# Patient Record
Sex: Male | Born: 1950 | Race: White | Hispanic: No | Marital: Married
Health system: Midwestern US, Community
[De-identification: ages and names within clinical notes are randomized; demographics above are authoritative.]

## PROBLEM LIST (undated history)

## (undated) DIAGNOSIS — R079 Chest pain, unspecified: Secondary | ICD-10-CM

## (undated) DIAGNOSIS — I471 Supraventricular tachycardia, unspecified: Secondary | ICD-10-CM

## (undated) DIAGNOSIS — Z8546 Personal history of malignant neoplasm of prostate: Secondary | ICD-10-CM

## (undated) DIAGNOSIS — R7303 Prediabetes: Secondary | ICD-10-CM

## (undated) DIAGNOSIS — Z125 Encounter for screening for malignant neoplasm of prostate: Secondary | ICD-10-CM

## (undated) DIAGNOSIS — E782 Mixed hyperlipidemia: Principal | ICD-10-CM

## (undated) DIAGNOSIS — Z1159 Encounter for screening for other viral diseases: Secondary | ICD-10-CM

---

## 2016-05-04 NOTE — Discharge Summary (Signed)
 Inpatient Clinical Summary             [facility]  Post-Acute Care Transfer Instructions  PERSON INFORMATION   Name: Wayne Stone, Wayne Stone   MRN: [id number]    FIN#: [id number]   PHYSICIANS  Admitting Physician: SIMPSON-MD,  DAMON  Attending Physician: SIMPSON-MD,  DAMON   PCP: Pcp, None  Discharge Diagnosis: Incarcerated umbilical hernia  Comment:       PATIENT EDUCATION INFORMATION  Instructions:             Anesthesia: After Your Surgery; HERNIA Antonetta [phone])  Medication Leaflets:               Follow-up:                          With: Address: When:   DAMON SIMPSON-MD [address]  [place]  [address]  [phone] Business (1) In 2 weeks 05/18/2016                           MEDICATION LIST  Medication Reconciliation at Discharge:         New Medications  Printed Prescriptions  HYDROcodone-acetaminophen  (Norco 325 mg-5 mg oral tablet) 1-2 tabs Oral (given by mouth) every 6 hours as needed moderate pain (4-7). Refills: 0.  Last Dose:____________________  Medications That Have Not Changed  Other Medications  aspirin (aspirin 81 mg oral tablet) 1 Tabs Oral (given by mouth) every day.  Last Dose:____________________  multivitamin (Multiple Vitamins oral tablet) 1 Tabs Oral (given by mouth) every day.  Last Dose:____________________         Patient's Final Home Medication List Upon Discharge:          aspirin (aspirin 81 mg oral tablet) 1 Tabs Oral (given by mouth) every day.  HYDROcodone-acetaminophen  (Norco 325 mg-5 mg oral tablet) 1-2 tabs Oral (given by mouth) every 6 hours as needed moderate pain (4-7). Refills: 0.  multivitamin (Multiple Vitamins oral tablet) 1 Tabs Oral (given by mouth) every day.         Comment:       ORDERS         Order Name Order Details   Discharge Patient 05/04/16 10:28:00 EDT, Discharge Home/Self Care

## 2016-05-04 NOTE — Discharge Summary (Signed)
 Inpatient Patient Summary               [facility]  [address]  [place], [place] [address]  [phone]  Patient Discharge Instructions     Name: Wayne Stone, Wayne Stone  Current Date: 05/04/2016 10:33:18  DOB: [birth year] MRN: [id number] FIN: [id number]  Patient Address: [address] [place] [facility] [address]  Patient Phone: [phone]  Primary Care Provider:  Name: Pcp, None  Phone:    Immunizations Provided:       Discharge Diagnosis: Incarcerated umbilical hernia  Discharged To: TO, ANTICIPATED%>  Home Treatments: TREATMENTS, ANTICIPATED%>  Devices/Equipment: EQUIPMENT REHAB%>  Post Hospital Services: HOSPITAL SERVICES%>  Professional Skilled Services: SKILLED SERVICES%>  Therapist, sports and Community Resources:                SERV AND COMM RES, ANTICIPATED%>  Mode of Discharge Transportation: TRANSPORTATION%>  Discharge Orders         Discharge Patient 05/04/16 10:28:00 EDT, Discharge Home/Self Care         Comment:      Medications   During the course of your visit, your medication list was updated with the most current information. The details of those changes are reflected below:         New Medications  Printed Prescriptions  HYDROcodone-acetaminophen  (Norco 325 mg-5 mg oral tablet) 1-2 tabs Oral (given by mouth) every 6 hours as needed moderate pain (4-7). Refills: 0.  Last Dose:____________________  Medications That Have Not Changed  Other Medications  aspirin (aspirin 81 mg oral tablet) 1 Tabs Oral (given by mouth) every day.  Last Dose:____________________  multivitamin (Multiple Vitamins oral tablet) 1 Tabs Oral (given by mouth) every day.  Last Dose:____________________         [facility] would like to thank you for allowing us  to assist you with your healthcare needs. The following includes patient education materials and information regarding your injury/illness.     Wayne Stone, Wayne Stone has been given the following list of follow-up instructions, prescriptions, and patient education  materials:  Follow-up Instructions             With: Address: When:   DAMON SIMPSON-MD [address], [address]  [place]  [address]  [phone] Business (1) In 2 weeks 05/18/2016                       It is important to always keep an active list of medications available so that you can share with other providers and manage your medications appropriately. As an additional courtesy, we are also providing you with your final active medications list that you can keep with you.           aspirin (aspirin 81 mg oral tablet) 1 Tabs Oral (given by mouth) every day.  HYDROcodone-acetaminophen  (Norco 325 mg-5 mg oral tablet) 1-2 tabs Oral (given by mouth) every 6 hours as needed moderate pain (4-7). Refills: 0.  multivitamin (Multiple Vitamins oral tablet) 1 Tabs Oral (given by mouth) every day.      Take only the medications listed above. Contact your doctor prior to taking any medications not on this list.        Discharge instructions, if any, will display below     Instructions for Diet: INSTRUCTIONS FOR DIET%>A Healthy Diet   Instructions for Supplements: SUPPLEMENT INSTRUCTIONS%>   Instructions for Activity: INSTRUCTIONS FOR ACTIVITY%>May shower, No Baths/Hot Tubs/Oceans/ or Pools, No bending, twisting or lifting,  You may drive if comfortable and not taking pain medication   Instructions for Wound Care: INSTRUCTIONS FOR WOUND CARE%>Other: Bacitracin to bellybutton daily.     Medication leaflets, if any, will display below         Patient education materials, if any, will display below        Discharge Instructions: After Your Surgery   Youve just had surgery. During surgery, you were given medicine called anesthesia to keep you relaxed and free of pain. After surgery, you may have some pain or nausea. This is common. Here are some tips for feeling better and getting well after surgery.       Stay on schedule with your medicine.    Going home   Your healthcare provider will show you how to take care of  yourself when you go home. He or she will also answer your questions. Have an adult family member or friend drive you home. For the first 24 hours after your surgery:    Do not drive or use heavy equipment.    Do not make important decisions or sign legal papers.    Do not drink alcohol.    Have someone stay with you, if needed. He or she can watch for problems and help keep you safe.   Be sure to go to all follow-up visits with your healthcare provider. And rest after your surgery for as long as your healthcare provider tells you to.   Coping with pain   If you have pain after surgery, pain medicine will help you feel better. Take it as told, before pain becomes severe. Also, ask your healthcare provider or pharmacist about other ways to control pain. This might be with heat, ice, or relaxation. And follow any other instructions your surgeon or nurse gives you.   Tips for taking pain medicine   To get the best relief possible, remember these points:    Pain medicines can upset your stomach. Taking them with a little food may help.    Most pain relievers taken by mouth need at least 20 to 30 minutes to start to work.    Taking medicine on a schedule can help you remember to take it. Try to time your medicine so that you can take it before starting an activity. This might be before you get dressed, go for a walk, or sit down for dinner.    Constipation is a common side effect of pain medicines. Call your healthcare provider before taking any medicines such as laxatives or stool softeners to help ease constipation. Also ask if you should skip any foods. Drinking lots of fluids and eating foods such as fruits and vegetables that are high in fiber can also help. Remember, do not take laxatives unless your surgeon has prescribed them.    Drinking alcohol and taking pain medicine can cause dizziness and slow your breathing. It can even be deadly. Do not drink alcohol while taking pain medicine.    Pain medicine  can make you react more slowly to things. Do not drive or run machinery while taking pain medicine.   Your healthcare provider may tell you to take acetaminophen  to help ease your pain. Ask him or her how much you are supposed to take each day. Acetaminophen  or other pain relievers may interact with your prescription medicines or other over-the-counter (OTC) medicines. Some prescription medicines have acetaminophen  and other ingredients. Using both prescription and OTC acetaminophen  for pain can cause you to overdose.  Read the labels on your OTC medicines with care. This will help you to clearly know the list of ingredients, how much to take, and any warnings. It may also help you not take too much acetaminophen . If you have questions or do not understand the information, ask your pharmacist or healthcare provider to explain it to you before you take the OTC medicine.   Managing nausea   Some people have an upset stomach after surgery. This is often because of anesthesia, pain, or pain medicine, or the stress of surgery. These tips will help you handle nausea and eat healthy foods as you get better. If you were on a special food plan before surgery, ask your healthcare provider if you should follow it while you get better. These tips may help:    Do not push yourself to eat. Your body will tell you when to eat and how much.    Start off with clear liquids and soup. They are easier to digest.    Next try semi-solid foods, such as mashed potatoes, applesauce, and gelatin, as you feel ready.    Slowly move to solid foods. Dont eat fatty, rich, or spicy foods at first.    Do not force yourself to have 3 large meals a day. Instead eat smaller amounts more often.    Take pain medicines with a small amount of solid food, such as crackers or toast, to avoid nausea.       Call your surgeon if.    You still have pain an hour after taking medicine. The medicine may not be strong enough.    You feel too sleepy, dizzy,  or groggy. The medicine may be too strong.    You have side effects like nausea, vomiting, or skin changes, such as rash, itching, or hives.        If you have obstructive sleep apnea   You were given anesthesia medicine during surgery to keep you comfortable and free of pain. After surgery, you may have more apnea spells because of this medicine and other medicines you were given. The spells may last longer than usual.    At home:    Keep using the continuous positive airway pressure (CPAP) device when you sleep. Unless your healthcare provider tells you not to, use it when you sleep, day or night. CPAP is a common device used to treat obstructive sleep apnea.    Talk with your provider before taking any pain medicine, muscle relaxants, or sedatives. Your provider will tell you about the possible dangers of taking these medicines.      2000-2017 [employer] [address], [place], [place] [address]. All rights reserved. This information is not intended as a substitute for professional medical care. Always follow your healthcare professional's instructions.                Hernia [Adult]    A hernia is a bulge of the intestines or surrounding tissues through a tear in the muscle of the abdomen or groin. This may occur as a result of excessive coughing, heavy lifting or being overweight. It can also occur at the site of prior surgery. When a hernia first appears it may be painful due to stretching and tearing of the muscle fibers. When you lie down, the bulge should reduce in size or disappear completely. If it does not, and you are unable to flatten it with your hand, medical attention is needed at once.  Home Care:  Avoid heavy lifting and straining or any activities that cause pain in the hernia.  You may shower.  Do not soak in a tub or pool for 2 weeks.    Follow Up  with your physician as directed by our staff.  Call [phone] for followup appointment.    Get Prompt Medical Attention  if  any of the following occur:   Increasing size of the hernia   Increasing pain in the hernia   A hernia that does not get smaller when you lie down   Hardening of the hernia   Abdominal swelling   Pain moves to the lower right abdomen (just below the waistline) or spreads to the back   Temp greater than 101   Nausea and Vomiting   Diarrhea or Constipation     2000-2015 [employer] [address] [place], [place] [address]. All rights reserved. This information is not intended as a substitute for professional medical care. Always follow your healthcare professional's instructions.                IS IT A STROKE?  Act FAST and Check for these signs:     FACE                  Does the face look uneven?     ARM                    Does one arm drift down?     SPEECH             Does their speech sound strange?     TIME                   Call 9-1-1 at any sign of stroke  Heart Attack Signs  Chest discomfort: Most heart attacks involve discomfort in the center of the chest and lasts more than a few minutes, or goes away and comes back. It can feel like uncomfortable pressure, squeezing, fullness or pain.  Discomfort in upper body: Symptoms can include pain or discomfort in one or both arms, back, neck, jaw or stomach.  Shortness of breath: With or without discomfort.  Other signs: Breaking out in a cold sweat, nausea, or lightheaded.  Remember, MINUTES DO MATTER. If you experience any of these heart attack warning signs, call 9-1-1 to get immediate medical attention!             Yes - Patient/Family/Caregiver demonstrates understanding of instructions given  ______________________________ ___________ ___________________ ___________  Patient/Family/ Caregiver Signature Date/Time          Provider Signature Date/Time

## 2016-05-04 NOTE — Nursing Note (Signed)
Nursing Discharge Summary - Text       Physician Discharge Summary Entered On:  05/04/2016 10:26 EDT    Performed On:  05/04/2016 10:26 EDT by Ivonne AndrewSIMPSON-MD,  Niles Ess               DC Information   Provider Instructions for Wound Care :   Other: Bacitracin to bellybutton daily.    Vinnie LevelROVENZANO, RN, RACHEL V - 05/04/2016 10:32 EDT   Provider Instructions for Diet :   A Healthy Diet   Provider Instructions for Activity :   May shower, No Baths/Hot Tubs/Oceans/ or Pools, No bending, twisting or lifting, You may drive if comfortable and not taking pain medication   Charene Mccallister-MD,  Trenee Igoe - 05/04/2016 10:26 EDT

## 2016-05-04 NOTE — Procedures (Signed)
 IntraOp Record - [facility]             IntraOp Record - [facility] Summary                                                                   Primary Physician:        SIMPSON-MD,  DAMON    Case Number:              [id number]    Finalized Date/Time:      05/04/16 10:20:26    Pt. Name:                 Wayne Stone, Wayne Stone    D.O.B./Sex:               07-08-1951    Male    Med Rec #:                [id number]    Physician:                DARIEN PRUDE    Financial #:              [id number]    Pt. Type:                 S    Room/Bed:                 /    Admit/Disch:              05/04/16 06:00:00 -    Institution:       [facility] - Case Times                                                                                                         Entry 1                                                                                                          Patient      In Room Time             05/04/16 08:00:00               Out Room Time                   05/04/16 10:20:00    Anesthesia     Procedure  Start Time               05/04/16 08:26:00               Stop Time                       05/04/16 10:13:00    Last Modified By:         PERRI, RN, KELLY                              05/04/16 10:20:21      [facility] - Case Times Audit                                                                          05/04/16 10:20:21         Owner: GEANNIE                               Modifier: SCARKE                                                        <+> 1         Out Room Time     05/04/16 10:16:21         Owner: GEANNIE                               Modifier: GEANNIE                                                        <+> 1         Stop Time     05/04/16 08:32:11         Owner: GEANNIE                               Modifier: GEANNIE                                                        <+> 1         Start Time        [facility] - Safety Checklist - Sign In  Entry 1                                                                                                          History/Physical on       Yes                             Procedure Consent               Yes    Chart                                                     on Chart     Site Marked (if           Yes    applicable)     Last Modified By:         PERRI, RN, KELLY                              05/04/16 08:22:44      [facility] - Case Attendance                                                                                                    Entry 1                         Entry 2                         Entry 3                                          Case Attendee             WILSON-MD,  JAMES P             SIMPSON-MD,  DAMON              PRITCHER, RN, LISA A    Role Performed            Anesthesiologist                Surgeon Primary                 First Assistant    Time In  05/04/16 08:00:00               05/04/16 08:00:00               05/04/16 08:00:00    Time Out     Procedure                 Hernia Repair Ventral           Hernia Repair Ventral           Hernia Repair Ventral                              or Incisional Robo              or Incisional Robo              or Incisional Robo    Last Modified By:         PERRI RN, BURNARD PERRI, RN, KELLY           SCARBROUGH, RN, KELLY                              05/04/16 08:32:02               05/04/16 08:32:02               05/04/16 08:32:02                                Entry 4                         Entry 5                                                                          Case Attendee             MARIJEAN, LPN, MADELIN KATHEE PERRI, RN, KELLY    Role Performed            Surgical Scrub                  Circulator    Time In                   05/04/16 08:00:00               05/04/16 08:00:00    Time Out     Procedure                 Hernia Repair Ventral           Hernia Repair Ventral                               or Incisional Robo              or Incisional Robo    Last Modified By:  [facility], RN, BURNARD MOONS, RN, [facility]                              05/04/16 08:32:02               05/04/16 08:32:02      [facility] - Case Attendance Audit                                                                     05/04/16 08:32:02         Owner: GEANNIE                               Modifier: SCARKE                                                            1     <*> Procedure                              Hernia Repair Ventral or Incisional Robo            2     <*> Procedure                              Hernia Repair Ventral or Incisional Robo            3     <+> Time In            3     <*> Procedure                              Hernia Repair Ventral or Incisional Robo            4     <+> Time In            4     <*> Procedure                              Hernia Repair Ventral or Incisional Robo            5     <+> Time In            5     <*> Procedure                              Hernia Repair Ventral or Incisional Robo     05/04/16 08:21:56         Owner: GEANNIE  Modifier: SCARKE                                                            1     <+> Time In            1     <*> Procedure                              Hernia Repair Ventral or Incisional Robo            2     <+> Time In            2     <*> Procedure                              Hernia Repair Ventral or Incisional Robo        <+> 3         Case Attendee        <+> 3         Role Performed        <+> 3         Procedure        <+> 4         Case Attendee        <+> 4         Role Performed        <+> 4         Procedure        <+> 5         Case Attendee        <+> 5         Role Performed        <+> 5         Procedure        [facility] - Skin Assessment                                                                          Pre-Care Text:            A.240 Assesses baseline skin condition Im.120 Implements  protective measures to prevent skin or tissue injury           due to mechanical sources  Im.280.1 Implements progective measures to prevent skin or tissue injury due to           thermal sources Im.360 Monitors for signs and symptons of infection                              Entry 1  Skin Integrity            Intact    Last Modified ByBETHA MOONS, RN, KELLY                              05/04/16 08:43:28    Post-Care Text:            E.10 Evaluates for signs and symptoms of physical injury to skin and tissue E.270 Evaluate tissue perfusion           O.60 Patient is free from signs and symptoms of injury caused by extraneous objects   O.210 Patinet's tissue           perfusion is consistent with or improved from baseline levels      [facility] - Patient Positioning                                                                      Pre-Care Text:            A.240 Assesses baseline skin condition A.280 Identifies baseline musculoskeletal status A.280.1 Identifies           physical alterations that require additional precautions for procedure-specific positioning A.510.8 Maintains           patient's dignity and privacy Im.120 Implements protective measures to prevent skin/tissue injury due to           mechanical sources Im.40 Positions the patient Im.80 Applies safety devices                              Entry 1                                                                                                          Procedure                 Hernia Repair Ventral           Body Position                   Supine                              or Incisional Robo    Left Arm Position         Tucked and Padded at            Right Arm Position              Tucked and Padded at  Side                                                            Side    Left Leg Position         Extended  Security               Right Leg Position              Extended Security                              Strap, Pillow Under Knee                                        Strap, Pillow Under Knee    Feet Uncrossed            Yes                             Pressure Points                 Yes                                                              Checked     Positioning Device        Pillow, Gel Pad Full            Positioned By                   OTHO, RN, LISA A,                              Body, Head Rest Foam,                                           SCARBROUGH, RN, KELLY                              Safety Strap    Outcome Met (O.80)        Yes    Last Modified ByBETHA MOONS, RN, KELLY                              05/04/16 08:38:33    Post-Care Text:            A.240 Assesses baseline skin condition A.280 Identifies baseline musculoskeletal status A.280.1 Identifies           physical alterations that require additional precautions for procedure-specific positioning A.510.8 Maintains           patient's dignity and privacy Im.120 Implements  protective measures to prevent skin/tissue injury due to           mechanical sources Im.40 Positions the patient Im.80 Applies safety devices      RH[facility] - Skin Prep                                                                                Pre-Care Text:            A.30 Verifies allergies A.20 Verifies procedure, surgical site, and laterality A.510.8 Maintains paritnet's           dignity and privacy Im.270 Performs Skin Preparation Im.270.1 Implements protective measures to prevent skin           and tissue injury due to chemical sources  A.300.1 Protects from cross-contamination                              Entry 1                                                                                                          Hair Removal     Skin Prep      Prep Agents (Im.270)     Chlorhexidine Gluconate         Prep Area (Im.270)              Abdomen                               2% w/Alcohol     Prep By                  DARIEN,  DAMON    Outcome Met (O.100)       Yes    Last Modified ByBETHA MOONS, RN, KELLY                              05/04/16 08:38:56    Post-Care Text:            E.10 Evaluates for signs and symptoms of physical injury to skin and tissue O.100 Patient is free from signs           and symptoms of chemical injury  O.740 The patient's right to privacy is maintained      [facility] - Counts Initial and Final  Pre-Care Text:            A.20.2 - Assesses the risk for unintended retained surgical items Im.20 - Performs required counts                              Entry 1                                                                                                          Initial Counts      Initial Counts           MITCHUM, LPN, TAMMY B,          Items included in               Instruments, Sponges,     Performed By             PERRI, RN, KELLY           the Initial Count               Sharps    Final Counts      Final Counts             SCARBROUGH, RN, KELLY,          Final Count Status              Correct     Performed By             MARIJEAN, LPN, TAMMY B     Items Included in        Sponges, Sharps     Final Count     Outcome Met (O.20)        Yes    Last Modified ByBETHA PERRI, RN, KELLY                              05/04/16 10:07:39    Post-Care Text:            E.50 - Evaluates results of the surgical count O.20 - Patient is free from unintended retained surgical items      [facility] - Counts Initial and Final Audit                                                            05/04/16 10:07:39         Owner: GEANNIE                               Modifier: SCARKE                                                        <+>  1         Final Count Status        <+> 1         Outcome Met (O.20)        [facility] - Counts Additional                                                                         Pre-Care Text:            A.20 Verifies operative procedure, sugical site, and laterality A.20.2 Assesses the risk for unintended           retained foreign body Im.20 Performs required counts                              Entry 1                                                                                                          Additional Count          Closing Count                   Additional Count                MITCHUM, LPN, TAMMY B,    Type                                                      Participants                    SCARBROUGH, RN, KELLY    Count Status              Correct                         Items Counted                   Sponges, Sharps    Outcome Met (O.20)        Yes    Last Modified ByBETHA MOONS, RN, KELLY                              05/04/16 10:07:32    Post-Care Text:            E.50 Evaluates results of the surgical count O.20 Patient is free from unintended retained foreign objects      [facility] - Counts Additional Audit  05/04/16 10:07:32         Owner: GEANNIE                               Modifier: SCARKE                                                        <+> 1         Count Status        <+> 1         Outcome Met (O.20)        [facility] - General Case Data                                                                        Pre-Care Text:            A.350.1 Classifies surgical wound                              Entry 1                                                                                                          Case Information      ASA Class                2                               Case Level                      Level 4     OR                       RH7 03                          Specialty                       Robotics (SN)     Wound Class              1-Clean    Preop Diagnosis           UMBILICAL HERNIA    Last Modified By:         PERRI, RN, KELLY  05/04/16 08:22:19    Post-Care Text:            O.760 Patient receives consistent and comparable care regardless of the setting      [facility] - Fire Risk Assessment                                                                                               Entry 1                                                                                                          Fire Risk                 Alcohol Based Prep              Fire Risk Score                 2    Assessment: If            Solution, Ignition    checked, checkmark        Source In Use    = 1 point     Last Modified By:         PERRI, RN, KELLY                              05/04/16 08:41:50      RH[facility] - Safety Checklist - Sign Out                                                              Pre-Care Text:            Im.330 Manages specimen handling and disposition                              Entry 1  Patient Safety            Yes    Communication Guide     Used Throughout Case     Last Modified ByBETHA MOONS, RN, KELLY                              05/04/16 10:07:20    Post-Care Text:            E.800 Ensures continuity of care E.50 Evaluates results of the surgical count O.30 Patient's procedure is           performed on the correct site, side, and level O.50 patient's current status is communicated throughout the           continuum of care O.40 Patient's specimen(s) is managed in the appropriate manner      RH[facility] - Cautery                                                                                  Pre-Care Text:            A.240 Assesses baseline skin condition A280.1 Identifies baseline musculoskeletal status Im.50 Implements           protective measures to prevent injury due to electrical sources  Im.60 Uses supplies and equipment within safe           parameters Im.80 Applies safety devices                               Entry 1                                                                                                          ESU Type                  GENERATOR                       Identification                  [id number]                              COVIDIEN/[employer]              Number     Coag Setting (watts)      35                              Cut Setting (watts)  35    Endocut                   No                              Grounding Pad                   Yes                                                              Needed?     Grounding Pad Site        Thigh, left                     Grounding Pad                   PRITCHER, RN, LISA A                                                              Applied By     Outcome Met (O.10)        Yes    Last Modified ByBETHA MOONS, RN, KELLY                              05/04/16 08:36:19    Post-Care Text:            E.10 Evaluates for signs and symptoms of physical injury to skin and tissue O.10 Patient is free from signs and           symptoms of injury related to thermal sources  O.70 Patient is free from signs and symptoms of electrical injury      [facility] - Patient Care Devices                                                                     Pre-Care Text:            A.200 Assesses risk for normothermia regulation A.40 Verifies presence of prosthetics or corrective devices           Im.280 Implements thermoregulation measures Im.60 Uses supplies and equipment within safe parameters                              Entry 1                         Entry 2  Equipment Type            MACHINE SEQUENTIAL              BAIR HUGGER                              COMPRESSION    SCD Sleeve Site           Legs Bilateral    Equipment/Tag Number      [id number]                           [phone]    Initiated Pre             Yes    Induction     Last Modified By:         PERRI RN, BURNARD PERRI, RN, KELLY                              05/04/16 08:37:40               05/04/16 08:37:40    Post-Care Text:            E.10 Evaluates signs and symptoms of physical injury to skin and tissue O.60 Patient is free from signs and           symptoms of injury caused by extraneous objects      [facility] - Medications                                                                              Pre-Care Text:            A.10 Confirms patient identity A.30 Verifies allergies Im.220 Administers prescribed medications Im.220.2           Administers prescribed antibiotic therapy as ordered                              Entry 1                                                                                                          Time Administered         05/04/16 08:26:00               Medication                      INACTIVE ITEM ***  zzBUPIVACAINE 0.5%                                                                                              EPINEPHRINE INJECTION                                                                                                 Route of Admin            Local Injection                 Dose/Volume                                                                                  (include amount and                                                               unit of measure)     Site                      Abdomen                         Administered By                 DARIEN,  DAMON    Outcome Met (O.130)       Yes    Last Modified ByBETHA MOONS, RN, KELLY                              05/04/16 08:49:12    Post-Care Text:            E.20 Evaluates response to medications O.130 Patient receives appropriately administerd medication(s)      RH[facility] - Implants/Endoscopy Stents  Pre-Care Text:            A.20 Verifies  operative procedure, surgical site, and laterality A.20.1 Verifies consent for planned procedure           Im.350 Records implants inserted during the operative or invasive procedure                              Entry 1                                                                                                          Implant/Explant           Implant                         Catalog #                      PMH    Implant     Identification      Description              MESH HERNIA WENDIE NI         Expiration Date                 01/23/21                              OLEGARIO NI     Lot Number               [id number]                          Manufacturer                    [employer]    Usage Data      Implant Site             Umbilical hernia                Quantity                        1    Last Modified By:         PERRI, RN, KELLY                              05/04/16 09:07:00    Post-Care Text:            E.30 Evaluates verification process for correct patient, site, side and level surgery O.30 Patient's procedure           is performed on the correct site, side, and level      [facility] - Communication  Pre-Care Text:            A.520 Identifies barriers to communication (Patient and Family Communications) A.20 Verifies operative           procedure, surgical site, and laterality Sports coach) Im.500 Provides status reports to family           members Im.150 Develops individualized plan of care                              Entry 1                         Entry 2                                                                          Communication             Phone Call                      Phone Call    Communication By          [facility], RN, BURNARD MOONS, RN, KELLY    Date and Time             05/04/16 09:15:00               05/04/16 10:07:00    Communication To          friend Marcey                    friend  Marcey    Last Modified By:         MOONS, RN, BURNARD MOONS, RN, [facility]                              05/04/16 09:29:32               05/04/16 10:10:43    Post-Care Text:            E.520 Evaluates psychosocial response to plan of care O.500 Patient or designated support person demonstrates           knowledge of the expected psychosocial responses to the procedure E.800 Ensures continuity of care O.50           Patient's current status is communicated throughout the continuum of care      [facility] - Communication Audit                                                                       05/04/16 10:10:43         Owner: GEANNIE  Modifier: SCARKE                                                        <+> 2         Communication        <+> 2         Communication By        <+> 2         Date and Time        <+> 2         Communication To        [facility] - Dressing/Packing                                                                         Pre-Care Text:            A.350 Assesses susceptibility for infection Im.250 Administers care to invasive devices Im.290 Administer care           to wound sites  Im.300 Implements aseptic technique                              Entry 1                                                                                                          Site                      Abdomen    Dressing Item     Details      Dressing Item            Liquid Bandage, 2x2's,     (Im.290)                 Occlusive Dressing,                              Other (See Comment)    Last Modified ByBETHA MOONS, RN, KELLY                              05/04/16 10:16:15    Post-Care Text:            E.320 Evaluate factors associted with increased risk for postoperative infection at the completion of the           procedure O.200 Patient's wound perfusion is consistent with or improved from baseline levels  O.Patient is           free from signs and symptoms of  infection    General Comments:            baci ointment/2x2/tegaderm at umbilicus      RHOR - Dressing/Packing Audit                                                                    05/04/16 10:16:15         Owner: GEANNIE                               Modifier: SCARKE                                                            1     <*> Site                                   Abdomen            1     <*> Dressing Item (Im.290)                 Liquid Bandage, 2x2's, Occlusive Dressing, Other (See Comment)        Entry 2 was deleted.  Higher numbered entries shifted one position to fill the gap.        <-> 2         Site                                   Abdomen        <-> 2         Dressing Item (Im.290)                 Liquid Bandage, Other (See Comment), Occlusive Dressing, 2x2's     05/04/16 10:15:31         Owner: GEANNIE                               Modifier: SCARKE                                                            2     <*> Site            2     <*> Dressing Item (Im.290)     05/04/16 10:12:23         Owner: GEANNIE  Modifier: SCARKE                                                            1     <*> Dressing Item (Im.290)            1     <*> Dressing Item (Im.290)            1     <*> Dressing Item (Im.290)                 Liquid Bandage            1     <*> Dressing Item (Im.290)                 Liquid Bandage            1     <*> Dressing Item (Im.290)                 Liquid Bandage        RH[facility] - Procedures                                                                               Pre-Care Text:            A.20 Verifies operative procedure, surgical site, and laterality Im.150 Develops individualized plan of care                              Entry 1                                                                                                          Procedure     Description      Procedure                Hernia Repair Ventral           Surgical Procedure               ROBOTIC REPAIR OF                              or Incisional Robotic           Text                            UMBILICAL HERNIA  Assisted    Primary Procedure         Yes                             Primary Surgeon                 DARIEN  DAMON    Start                     05/04/16 08:26:00               Stop                            05/04/16 10:13:00    Anesthesia Type           General                         Surgical Service                Robotics (SN)    Wound Class               1-Clean    Last Modified By:         PERRI, RN, KELLY                              05/04/16 10:16:31    Post-Care Text:            O.730 The patinet's care is consistent with the individualized perioperative plan of care      RHOR - Procedures Audit                                                                          05/04/16 10:16:31         Owner: GEANNIE                               Modifier: SCARKE                                                            1     <*> Procedure                              Hernia Repair Ventral or Incisional Robotic Assisted            1     <+> Stop        RHOR - Safety Checklist - Time Out  Pre-Care Text:            A.10 Confirms patient identity A.20 Verifies operative procedure, surgical site, and laterality A.20.1 Verifies           consent for planned procedure A.30 Verifies allergies                              Entry 1                                                                                                          Surgical/Procedure        Yes                             Time Out Complete               05/04/16 08:22:00    Team confirms     correct patient,     correct site and     correct procedure     Last Modified By:         PERRI, RN, KELLY                              05/04/16 08:22:01    Post-Care Text:            E.30 Evaluates verification process for correct  patient, site, side, and level surgery      RHOR - Transfer                                                                                                           Entry 1                                                                                                          Transferred By            DONIA LYNWOOD SQUIBB,            Via  Stretcher                              PERRI, RN, [facility]    Post-op Destination       PACU    Skin Assessment      Condition                Intact    Last Modified ByBETHA PERRI, RN, KELLY                              05/04/16 08:49:59      Case Comments                                                                                         <None>              Finalized By: PERRI RN, BURNARD      Document Signatures                                                                             Signed By:           PERRI, RN, KELLY 05/04/16 10:20

## 2017-03-06 NOTE — Discharge Summary (Signed)
 Inpatient Clinical Summary             [facility]  Post-Acute Care Transfer Instructions  PERSON INFORMATION   Name: Wayne Stone, Wayne Stone  MRN: [id number]    FIN#: [id number]   PHYSICIANS  Admitting Physician: NOLA VOLNEY RAMAN  Attending Physician: NOLA VOLNEY RAMAN   PCP: GENNY GLENDIA ORN  Discharge Diagnosis:  Colon polyp  Comment:       PATIENT EDUCATION INFORMATION  Instructions:             Understanding Colon and Rectal Polyps; Anesthesia: Monitored Anesthesia Care (MAC) short (Custom); Lower GI Endoscopy  Medication Leaflets:               Follow-up:                                   MEDICATION LIST  Medication Reconciliation at Discharge:         Medications that have not changed  Other Medications  aspirin (aspirin 81 mg oral tablet) 1 Tabs Oral (given by mouth) every day.  Last Dose:____________________  multivitamin (Multiple Vitamins oral tablet) 1 Tabs Oral (given by mouth) every day.  Last Dose:____________________         Patient's Final Home Medication List Upon Discharge:          aspirin (aspirin 81 mg oral tablet) 1 Tabs Oral (given by mouth) every day.  multivitamin (Multiple Vitamins oral tablet) 1 Tabs Oral (given by mouth) every day.         Comment:       ORDERS         Order Name Order Details   Discharge Patient 03/06/17 13:21:00 EDT, Discharge Home/Self Care

## 2017-03-06 NOTE — Discharge Summary (Signed)
 Inpatient Patient Summary               [facility]  [address] [place], [place] [address]  [phone]  Patient Discharge Instructions     Name: Wayne Stone, Wayne Stone  Current Date: 03/06/2017 13:55:02  DOB: [birth year] [id number] FIN:[id number]  Patient Address: [address] [place] [facility] [address]  Patient Phone: [phone]  Primary Care Provider:  Name: GENNY GLENDIA ORN  Phone: [phone]   Immunizations Provided:      Discharge Diagnosis: Colon polyp  Discharged To: TO, ANTICIPATED%>  Home Treatments: TREATMENTS, ANTICIPATED%>  Devices/Equipment: EQUIPMENT REHAB%>  Post Hospital Services: HOSPITAL SERVICES%>  Professional Skilled Services: SKILLED SERVICES%>  Therapist, sports and Community Resources: SERV AND COMM RES, ANTICIPATED%>  Mode of Discharge Transportation: TRANSPORTATION%>  Discharge Orders:         Discharge Patient 03/06/17 13:21:00 EDT, Discharge Home/Self Care         Comment:   Medications  During the course of your visit, your medication list was updated with the most current information. The details of those changes are reflected below:         Medications that have not changed  Other Medications  aspirin (aspirin 81 mg oral tablet) 1 Tabs Oral (given by mouth) every day.  Last Dose:____________________  multivitamin (Multiple Vitamins oral tablet) 1 Tabs Oral (given by mouth) every day.  Last Dose:____________________       [facility] would like to thank you for allowing us  to assist you with your healthcare needs. The following includes patient education materials and information regarding your injury/illness.   Wayne Stone, Wayne Stone has been given the following list of follow-up instructions, prescriptions, and patient education materials:  Follow-up Instructions:           It is important to always keep an active list of medications available so that you can share with other providers and manage your medications appropriately. As an additional courtesy,  we are also providing you with your final active medications list that you can keep with you.           aspirin (aspirin 81 mg oral tablet) 1 Tabs Oral (given by mouth) every day.  multivitamin (Multiple Vitamins oral tablet) 1 Tabs Oral (given by mouth) every day.      Take only the medications listed above. Contact your doctor prior to taking any medications not on this list.  Discharge instructions, if any, will display below     Instructions for Diet: INSTRUCTIONS FOR DIET%>  Instructions for Supplements: SUPPLEMENT INSTRUCTIONS%>  Instructions for Activity: INSTRUCTIONS FOR ACTIVITY%>  Instructions for Wound Care: INSTRUCTIONS FOR WOUND CARE%>     Medication leaflets, if any, will display below     Patient education materials, if any, will display below        Understanding Colon and Rectal Polyps   The colon (also called the large intestine) is a muscular tube that forms the last part of the digestive tract. It absorbs water and stores food waste. The colon is about 4 to 6 feet long. The rectum is the last 6 inches of the colon. The colon and rectum have a smooth lining composed of millions of cells. Changes in these cells can lead to growths in the colon that can become cancerous and should be removed. Multiple tests are available to screen for colon cancer, but the colonoscopy is the most recommended test. During colonoscopy, these polyps can be removed. How often  you need this test depends on many things including your condition, your family history, symptoms, and what the findings were at the previous colonoscopy.    When the colon lining changes   Changes that happen in the cells that line the colon or rectum can lead to growths called polyps. Over a period of years, polyps can turn cancerous. Removing polyps early may prevent cancer from ever forming.   Polyps   Polyps are fleshy clumps of tissue that form on the lining of the colon or rectum. Small polyps are usually benign (not cancerous). However,  over time, cells in a polyp can change and become cancerous. Certain types of polyps known as adenomatous polyps are premalignant. The risk for invasive cancer increases with the size of the polyp and certain cell and gene features. This means that they can become cancerous if they're not removed. Hyperplastic polyps are benign. They can grow quite large and not turn cancerous.    Cancer   Almost all colorectal cancers start when polyp cells begin growing abnormally. As a cancerous tumor grows, it may involve more and more of the colon or rectum. In time, cancer can also grow beyond the colon or rectum and spread to nearby organs or to glands called lymph nodes. The cells can also travel to other parts of the body. This is known as metastasis. The earlier a cancerous tumor is removed, the better the chance of preventing its spread.         2000-2017 [employer] [address] [place], [place] [address]. All rights reserved. This information is not intended as a substitute for professional medical care. Always follow your healthcare professional's instructions.                Anesthesia: Monitored Anesthesia Care (MAC)  You're due to have surgery. During surgery, you'll be given medication called anesthesia. This will keep you comfortable and pain-free. Your surgeon will use monitored anesthesia care (MAC). This sheet tells you more about this type of anesthesia.  What is monitored anesthesia care?  MAC keeps you very drowsy during surgery. You may be awake, but you will likely not remember much. And you won't feel pain. With MAC, medications are given through an IV line into a vein in your arm or hand. A local anesthetic will usually be injected into the skin and muscle around the surgical site to numb it. The anesthesia provider monitors you during the procedure. He or she checks your heart rate and rhythm, blood pressure, and blood oxygen level.  Anesthesia tools and medications that may be near  you during your procedure  You will likely have:   A pulse oximeter on the end of your finger. This measures your blood oxygen level.   Electrocardiography leads (electrodes) on your chest. These record your heart rate and rhythm.   Medications given through an IV. These relax you and prevent pain. You may be awake or sleep lightly. If you have local anesthetic, it is injected directly into your skin.   A facemask to give you oxygen, if needed.  Risks and Possible Complications  MAC has some risks. These include:   Breathing problems   Nausea and vomiting   Allergic reaction to the anesthetic   Anesthesia safety   Follow all instructions you are given for how long not to eat or drink before your procedure.   Be sure your doctor knows what medications you take, especially any anti-inflammatory medication or blood thinners.  This includes aspirin and any other over-the-counter medications, herbs, and supplements.   Have an adult family member or friend drive you home after the procedure.   For the first 24 hours after your surgery:   Do not drive or use heavy equipment.   Do not make important decisions or sign documents.   Avoid alcohol.   Have someone stay with you, if possible. They can watch for problems and help keep you safe.     [address] [employer] [address] [place], [place] [address]. All rights reserved. This information is not intended as a substitute for professional medical care. Always follow your healthcare professional's instructions.          Lower GI Endoscopy       During endoscopy, a long, flexible tube is used to view the inside of your lower GI tract.     Lower GI endoscopy allows your healthcare provider to view your lower gastrointestinal (GI) tract. Your entire colon and rectum can be examined (colonoscopy). Or just the rectum and sigmoid colon can be examined (sigmoidoscopy).   Before the exam   Follow these and any other instructions you are given before  your endoscopy. If you dont follow the healthcare providers instructions carefully, the test may need to be cancelled or done over.    For a colonoscopy, you may be told not to eat and to drink only clear liquids for 1 to 3 days before the exam. Usually it is clear liquids for one day and sometimes other dietary changes even before that, based on your discussion with your healthcare provider.     Take any laxatives that are prescribed for you. An enema may also be prescribed.    Arrange for someone to drive you home after the exam if you will be sedated.    Tell your healthcare provider before the exam if you are taking any medicines, vitamins, supplements, recreational drugs, or have any medical problems.    Discuss possible alternatives to the procedure, and risks, with your healthcare provider.    The procedure    Colonoscopy can take 30 minutes or longer. Sigmoidoscopy often takes about 20 minutes. The length of the procedure depends a great deal on how clean your intestines are, the reason for the procedure, and what treatments must be done.     You lie on the stretcher or bed on your left side.    For colonoscopy, you are given sedating (relaxing) medicine through an IV line. Sigmoidoscopy usually doesnt need sedation.    The endoscope is inserted into your rectum. You may feel pressure and cramping. If you feel pain, tell your healthcare provider. You may receive more sedation, which includes pain medicine and an anti-anxiety medicine.     The endoscope carries images of your colon to a video screen. Prints of the images may be taken as a record of your exam.    Biopsies (tissue samples), polyp removal, or other treatments may be performed.     When the procedure is done, you rest for a time. You may have some discomfort right after the procedure from trapped air. This can be relieved by changing position and passing the air. If you have been sedated, you must have an adult drive you home.   When  to call your healthcare provider   Call if you have any of the following after the procedure:    Pain in your belly    Fever  Rectal bleeding      2000-2017 [employer] [address] [place], [place] [address]. All rights reserved. This information is not intended as a substitute for professional medical care. Always follow your healthcare professional's instructions.         IS IT A STROKE? Act FAST and Check for these signs:    FACE                         Does the face look uneven?    ARM                         Does one arm drift down?    SPEECH                    Does their speech sound strange?    TIME                         Call 9-1-1 at any sign of stroke  Heart Attack Signs  Chest discomfort: Most heart attacks involve discomfort in the center of the chest and lasts more than a few minutes, or goes away and comes back. It can feel like uncomfortable pressure, squeezing, fullness or pain.  Discomfort in upper body: Symptoms can include pain or discomfort in one or both arms, back, neck, jaw or stomach.  Shortness of breath: With or without discomfort.  Other signs: Breaking out in a cold sweat, nausea, or lightheaded.  Remember, MINUTES DO MATTER. If you experience any of these heart attack warning signs, call 9-1-1 to get immediate medical attention!     ---------------------------------------------------------------------------------------------------------------------  [facility] allows you to manage your health, view your test results, and retrieve your discharge documents from your hospital stay securely and conveniently from your computer.  To begin the enrollment process, visit [email or web address] Click on "Sign up now" under [facility].

## 2017-03-06 NOTE — Procedures (Signed)
Procedure Record - MPEND             Procedure Record - MPEND Summary                                                                Primary Physician:        Joanette Gula    Case Number:              [id number]    Finalized Date/Time:      03/06/17 13:31:24    Pt. Name:                 Wayne Stone, Wayne Stone    D.O.B./Sex:               09/26/1951    Male    Med Rec #:                [id number]    Physician:                Joanette Gula    Financial #:              [id number]    Pt. Type:                 S    Room/Bed:                 /    Admit/Disch:              03/06/17 09:49:00 -    Institution:       MPEND - Case Times                                                                                                        Entry 1                                                                                                          Patient      In Room Time             03/06/17 13:00:00               Out Room Time                   03/06/17 13:23:00    Anesthesia     Procedure      Start  Time               03/06/17 13:06:00               Stop Time                       03/06/17 13:20:00    Last Modified By:         Andres Labrum A                              03/06/17 13:31:23      MPEND - Case Times Audit                                                                         03/06/17 13:31:23         Owner: [id number]                               Modifier: [id number]                                                        <+> 1         Out Room Time     03/06/17 13:23:06         Owner: [id number]                               Modifier: [id number]                                                        <+> 1         Start Time     03/06/17 13:21:54         Owner: [id number]                               Modifier: [id number]                                                        <+> 1         Stop Time     03/06/17 13:04:45         Owner: [facility]                               Modifier: [id number]  1     <*> In Room Time                           03/06/17 11:59:00        MPEND - Case Attendance                                                                                                   Entry 1                         Entry 2                         Entry 3                                          Case Attendee             KORO-MD,  Modena Slater, MD, Georgina Pillion, RN, Daune Perch    Role Performed            Surgeon Primary                 Anesthesiologist                Circulator    Time In                   03/06/17 11:59:00    Time Out     Procedure                 Colonoscopy                     Colonoscopy                     Colonoscopy    Last Modified By:         Joya Gaskins, RN, Neita Garnet, RN, Edwena Blow, RN, Wallis A                              03/06/17 11:59:53               03/06/17 13:07:12               03/06/17 13:07:12                                Entry 4  Case Attendee             Ola Spurr, RN, SUSAN E    Role Performed            Endoscopy Technician    Time In     Time Out     Procedure                 Colonoscopy    Last Modified By:         Andres Labrum A                              03/06/17 13:07:12      MPEND - Case Attendance Audit                                                                    03/06/17 13:07:12         Owner: Gaspar Garbe                               Modifier: [id number]                                                        <+> 2         Case Attendee        <+> 2         Role Performed        <+> 2         Procedure        <+> 3         Case Attendee        <+> 3         Role Performed        <+> 3         Procedure        <+> 4         Case Attendee        <+> 4         Role Performed        <+> 4         Procedure     03/06/17 11:59:53         Owner: Gaspar Garbe                                Modifier: [email or web address]                                                            1     <*> Case Attendee                          KORO-MD,  NABEEL S  1     <*> Role Performed                         Surgeon Primary            1     <*> Time In                                03/06/17 11:59:00            1     <*> Procedure                              Colonoscopy        Entry 2 was deleted.  Higher numbered entries shifted one position to fill the gap.        <-> 2         Case Attendee                          Joya Gaskins RN, Early Chars        <-> 2         Role Performed                         Preoperative Nurse        <-> 2         Procedure                              Colonoscopy     03/06/17 11:59:24         Owner: [email or web address]                               Modifier: [email or web address]                                                            1     <+> Time In            1     <*> Procedure                              Colonoscopy        <+> 2         Case Attendee        <+> 2         Role Performed        <+> 2         Procedure        MPEND - Patient Positioning                                                                     Pre-Care Text:  A.240 Assesses baseline skin condition  A.280 Identifies baseline musculoskeletal status  A.280.1 Identifies           physical alterations that require additional precautions for procedure-specific positioning  A.510.8 Maintains           patient's dignity and privacy  Im.120 Implements protective measures to prevent skin/tissue injury due to           mechanical sources  Im.40 Positions the patient  Im.80 Applies safety devices                              Entry 1                                                                                                          Procedure                 Colonoscopy                     Body Position                   Lateral Right Up    Left Arm Position         Resting at Side                 Right Arm Position               Resting at Side    Left Leg Position         Extended                        Right Leg Position              Flexed    Feet Uncrossed            Yes                             Pressure Points                 Yes                                                              Checked     Positioning Device        Pillow                          Positioned By                   KORO-MD,  NABEEL S    Outcome Met (O.80)        Yes    Last Modified By:  Nelda Marseille, RN, Wallis A                              03/06/17 13:06:03    Post-Care Text:            E.10 Evaluates for signs and symptoms of physical injury to skin and tissue E.290 Evaluates musculoskeletal           status O.80 Patient is free from signs and symptoms of injury related to positioning O.120 the patient is free           from signs and symptoms of injury related to transfer/transport  O.250 Patient's musculoskeletal status is           maintained at or improved from baseline levels      MPEND - Skin Assessment                                                                         Pre-Care Text:            A.240 Assesses baseline skin condition Im.120 Implements protective measures to prevent skin or tissue injury           due to mechanical sources  Im.280.1 Implements progective measures to prevent skin or tissue injury due to           thermal sources Im.360 Monitors for signs and symptons of infection                              Entry 1                                                                                                          Skin Integrity            Intact    Last Modified By:         Nelda Marseille, RN, Hollace Hayward A                              03/06/17 13:07:42    Post-Care Text:            E.10 Evaluates for signs and symptoms of physical injury to skin and tissue E.270 Evaluate tissue perfusion           O.60 Patient is free from signs and symptoms of injury caused by extraneous objects   O.210 Patinet's tissue            perfusion is consistent with or improved from baseline levels      MPEND - Time Out - Procedure  Pre-Care Text:            A.10 Confirms patient identity A.20 Verifies operative procedure, surgical site, and laterality A.20.1 Verifies           consent for planned procedure A.30 Verifies allergies                              Entry 1                                                                                                          Procedure                 Colonoscopy                     Patient name and                Yes                                                              DOB confirmed     Surgical procedure        Yes                             Correct surgical                Yes    to be performed                                           site marked and     confirmed and                                             initials are     verified by                                               visible through     completed surgical                                        prepped and draped     consent  field (or                                                               alternative ID band                                                               used), if applicable     Allergies discussed       Yes                             Anticoagulation                 Yes                                                              status confirmed     Time Out Complete         03/06/17 13:05:00    Last Modified By:         Nelda Marseille RN, Hollace Hayward A                              03/06/17 13:07:30    Post-Care Text:            E.30 Evaluates verification process for correct patient, site, side, and level surgery      MPEND - Debrief - Procedure                                                                     Pre-Care Text:            Im.330 Manages specimen handling and disposition                               Entry 1                                                                                                          Procedure                 Colonoscopy  Actual procedure                Yes                                                              performed confirmed     Confirm specimens         Yes                             Patient recovery                Yes    and specimens                                             plan confirmed     labeled     appropriately (if     applicable)     Debrief Complete          03/06/17 13:21:00    Last Modified By:         Nelda Marseille RN, Hollace Hayward A                              03/06/17 13:21:49    Post-Care Text:            E.800 Ensures continuity of care E.50 Evaluates results of the surgical count O.30 Patient's procedure is           performed on the correct site, side, and level O.50 patient's current status is communicated throughout the           continuum of care O.40 Patient's specimen(s) is managed in the appropriate manner      MPEND - General Case Data                                                                                                 Entry 1                                                                                                          Case Information      ASA Class                2  Case Level                      Level 1     OR                       MP Endo 01                      Specialty                       Gastroenterology (SN)     Wound Class              No Incision    Preop Diagnosis           SCREENING                       Postop Diagnosis                screening    Last Modified By:         Andres Labrum A                              03/06/17 13:22:01      MPEND - General Case Data Audit                                                                  03/06/17 13:22:01         Owner: [id number]                               Modifier: [id number]                                                         <+> 1         Postop Diagnosis        MPEND - Procedures                                                                                                        Entry 1  Procedure     Description      Procedure                Colonoscopy                     Surgical Procedure              COLONOSCOPY with                                                              Text                            polypectomy    Primary Procedure         Yes                             Primary Surgeon                 Joanette Gula    Start                     03/06/17 13:06:00               Stop                            03/06/17 13:20:00    Anesthesia Type           Monitored Anesthesia            Surgical Service                Gastroenterology (SN)                              Care    Wound Class               No Incision    Last Modified By:         Andres Labrum A                              03/06/17 13:23:00      MPEND - Procedures Audit                                                                         03/06/17 13:23:00         Owner: [id number]                               Modifier: [id number]  1     <*> Procedure                              Colonoscopy            1     <+> Start            1     <*> Surgical Procedure Text                COLONOSCOPY        MPEND - Carbon Dioxide Insufflation                                                                                       Entry 1                                                                                                          Procedure                 Colonoscopy                     Carbon Dioxide                  REGULATOR CO2 ON WALL                                                              Insufflator     Insufflation Mode         Managed Flow                     Flow Rate                       2 L/min    Last Modified By:         Andres Labrum A                              03/06/17 13:05:05      MPEND - Specimens  Pre-Care Text:            A.20 Verifies operative procedure, surgical site, and laterality Im.320 Manages culture specimen collection           Im.330 Manages specimen handling and disposition                              Entry 1                                                                                                          Description               a. ascending polyp              Specimen Type                   Routine    Date/Time in              03/06/17 13:15:00    Formalin (for     breast tissue only)     Last Modified By:         Nelda Marseille RN, Hollace Hayward A                              03/06/17 13:21:37    Post-Care Text:            E.40 Evaluates correct processes have been performed for specimen handling and disposition O.40 Patient's           specimen(s) is managed in the appropriate manner      MPEND - Lower Endoscopy Detail                                                                                            Entry 1                                                                                                          Colonoscopy     Completion Details      Cecum Reached            Yes  Cecum Reached                   03/06/17 13:10:00     Withdrawal Time          03/06/17 13:20:00    Hemorrhoid     Treatment Details     Last Modified By:         Andres Labrum A                              03/06/17 13:20:57      MPEND - Lower Endoscopy Detail Audit                                                             03/06/17 13:20:57         Owner: [id number]                               Modifier: [id number]                                                        <+> 1         Withdrawal Time        <+> 1         Cecum  Reached        MPEND - Transfer                                                                                                          Entry 1                                                                                                          Transferred By            Barron Schmid, MD, Legrand Como,          Via                             [employer]  Nelda Marseille, RN, Daune Perch    Post-op Destination       PACU    Skin Assessment      Condition                Intact    Last Modified By:         Andres Labrum A                              03/06/17 13:07:53      Case Comments                                                                                         <None>              Finalized By: Meade Maw      Document Signatures                                                                             Signed By:           Nelda Marseille RN, Daune Perch 03/06/17 13:31

## 2017-03-06 NOTE — H&P (Signed)
[facility] OP H&P Short form koro        PatientTonia Stone:   Wayne Stone, Wayne Stone             MRN: [id number]            FIN: [id number]               Age:   66 years     Sex:  Male     DOB:  02/01/1951   Associated Diagnoses:   None   Author:   KORO-MD,  NABEEL S      Preoperative Information   Indication for surgery:  Gastrointestinal disorder.    Source of history:  Self.       Chief Complaint   screening colonoscopy       Health Status   Allergies:    Allergic Reactions (Selected)  No Known Allergies,    Allergies (1) Active Reaction  No Known Allergies None Documented     Current medications:  (Selected)   Inpatient Medications  Ordered  Lactated Ringer Bolus: 500 mL, IV Bolus, Once  Future  Lactated Ringers Injection 1,000 mL: 30 mL/hr, IV  Documented Medications  Documented  Multiple Vitamins oral tablet: 1 tabs, Oral, Daily, 30 tabs, 0 Refill(s)  aspirin 81 mg oral tablet: 81 mg, 1 tabs, Oral, Daily, 0 Refill(s),    Home Medications (2) Active  aspirin 81 mg oral tablet 81 mg = 1 tabs, Oral, Daily  Multiple Vitamins oral tablet 1 tabs, Oral, Daily  ,    No qualifying data available     Problem list:    Patient Stated  Wears glasses / [id number] / Confirmed  Inactive: umbilical hernia,    Active Problems (2)  Incarcerated umbilical hernia   Wears glasses         Histories   Past Medical History:    No active or resolved past medical history items have been selected or recorded.   Family History:    No family history items have been selected or recorded.   Procedure history:    Hernia Repair Ventral or Incisional Robotic Assisted on 05/04/2016 at 66 Years.  Comments:  05/04/2016 10:20 - SCARBROUGH, RN, KELLY  auto-populated from documented surgical case  Colonoscopy ([id number]) in 2011 at 66 Years.   Social History        Social & Psychosocial Habits    Alcohol  03/02/2017  Use: Current    Type: Beer, Wine    Frequency: 1-2 times per month    Substance Abuse  05/04/2016  Use: Denies    Tobacco  07/26/2016  Use: Never smoker  .         Physical Examination   Vital Signs   03/06/2017 12:00 EDT Temperature Oral 36.9 degC    Heart Rate Monitored 63 bpm    Respiratory Rate 16 br/min    Systolic Blood Pressure 128 mmHg    Diastolic Blood Pressure 72 mmHg    SpO2 96 %    SBP/DBP Cuff Details Left arm         Vital Signs (last 24 hrs)_____  Last Charted___________  Temp Oral     36.9 degC  (JUN 11 12:00)  Resp Rate         16 br/min  (JUN 11 12:00)  SBP      128 mmHg  (JUN 11 12:00)  DBP      72 mmHg  (JUN 11 12:00)  SpO2      96 %  (  JUN 11 12:00)  Weight      94 kg  (JUN 11 12:00)  Height      177.8 cm  (JUN 11 12:00)     Measurements from flowsheet : Measurements   03/06/2017 12:00 EDT Height/Length Measured 177.8 cm    Weight Measured 94 kg    Weight Dosing 94 kg    Body Mass Index est meas 29.73      General:  Alert and oriented, No acute distress.    Eye:  Pupils are equal, round and reactive to light, Extraocular movements are intact, Deferred.Marland Kitchen    HENT:  Normocephalic, Tympanic membranes are clear, Deferred..    Neck:  Non-tender, Deferred.Marland Kitchen    Respiratory:  Lungs are clear to auscultation, Respirations are non-labored.    Cardiovascular:  Normal rate, Regular rhythm, No murmur.    Gastrointestinal:  Soft, Non-tender, Deferred..    Genitourinary:  Deferred..    Lymphatics:  No lymphadenopathy neck, axilla, groin, Deferred..    Musculoskeletal     Normal range of motion.     Normal strength.     Deferred..     Integumentary:  Warm, Deferred.Marland Kitchen    Psychiatric:  Cooperative, Appropriate mood & affect, Normal judgment, normal mental status.       Health Maintenance   Immunization schedule      Health Maintenance     Pending (in the next year)     There are no current recommendations pending     Satisfied (in the past 1 year)     There are no satisfied recommendations within the defined date range          Review / Management   Results review:     No qualifying data available, Lab results: 03/06/2017 12:17 EDT      Estimated Creatinine Clearance             83.66 mL/min  .       Impression and Plan   Condition:  Stable.    Counseled:  Patient.    Proceed with scheduled procedure.   Signature Line     Electronically Signed on 03/06/2017 12:28 PM EDT   ________________________________________________   Janene Harvey

## 2019-03-20 LAB — CBC WITH AUTO DIFFERENTIAL
Basophils %: 0.5 % (ref 0.0–2.0)
Basophils Absolute: 0 10*3/uL (ref 0.0–0.2)
Eosinophils %: 3 % (ref 0.0–7.0)
Eosinophils Absolute: 0.2 10*3/uL (ref 0.0–0.5)
Hematocrit: 38.1 % (ref 38.0–52.0)
Hemoglobin: 12.7 g/dL (ref 12.0–17.3)
Immature Grans (Abs): 0 10*3/uL
Immature Granulocytes %: 0.3 %
Lymphocytes Absolute: 2.3 10*3/uL (ref 1.0–3.2)
Lymphocytes: 35.9 % (ref 15.0–45.0)
MCH: 28.4 pg (ref 27.0–34.5)
MCHC: 33.3 g/dL (ref 32.0–36.0)
MCV: 85.2 fL (ref 84.0–100.0)
MPV: 8.9 fL (ref 7.2–13.2)
Monocytes %: 10.6 % (ref 4.0–12.0)
Monocytes Absolute: 0.7 10*3/uL (ref 0.3–1.0)
NRBC Absolute: 0 10*3/uL
NRBC Automated: 0 %
Neutrophils %: 49.7 % (ref 42.0–74.0)
Neutrophils Absolute: 3.1 10*3/uL (ref 1.6–7.3)
Platelets: 412 10*3/uL (ref 140–440)
RBC: 4.47 x10e6/mcL (ref 4.00–5.20)
RDW: 14.5 % (ref 11.0–16.0)
WBC: 6.3 10*3/uL (ref 3.8–10.6)

## 2019-03-20 LAB — COMPREHENSIVE METABOLIC PANEL
ALT: 19 U/L (ref 0–41)
AST: 18 U/L (ref 0–40)
Albumin/Globulin Ratio: 1.7 mmol/L (ref 1.00–2.00)
Albumin: 4.3 g/dL (ref 3.5–5.2)
Alk Phosphatase: 57 U/L (ref 40–130)
Anion Gap: 10 mmol/L (ref 2–17)
BUN: 21 mg/dL (ref 8–23)
CO2: 27 mmol/L (ref 22–29)
Calcium: 9.6 mg/dL (ref 8.8–10.2)
Chloride: 103 mmol/L (ref 98–107)
Creatinine: 0.9 mg/dL (ref 0.7–1.3)
GFR African American: 101 mL/min/{1.73_m2} (ref >=90)
GFR Non-African American: 87 mL/min/{1.73_m2} — ABNORMAL LOW (ref >=90)
Globulin: 3 g/dL (ref 1.9–4.4)
Glucose: 97 mg/dL (ref 70–99)
Osmolaliy Calculated: 282 mosm/kg (ref 270–287)
Potassium: 4.2 mmol/L (ref 3.5–5.3)
Sodium: 140 mmol/L (ref 135–145)
Total Bilirubin: 1.5 mg/dL — ABNORMAL HIGH (ref 0.00–1.20)
Total Protein: 6.8 g/dL (ref 6.4–8.3)

## 2019-03-20 LAB — PSA SCREENING: PSA, Screening: 7.41 ng/mL — ABNORMAL HIGH (ref 0.000–4.000)

## 2019-03-20 LAB — LIPID PANEL
Chol/HDL Ratio: 2.5 (ref 0.0–4.4)
Cholesterol: 181 mg/dL (ref 100–200)
HDL: 73 mg/dL — ABNORMAL HIGH (ref 55–72)
LDL Cholesterol: 92.2 mg/dL (ref 0.0–100.0)
LDL/HDL Ratio: 1.3
Triglycerides: 79 mg/dL (ref 0–149)
VLDL: 15.8 mg/dL (ref 5.0–40.0)

## 2019-07-22 LAB — PROSTATE SPECIFIC ANTIGEN, TOTAL: PSA: 7.03 ng/mL — ABNORMAL HIGH (ref 0.000–4.000)

## 2020-01-28 LAB — LIPID PANEL
Chol/HDL Ratio: 2.7 (ref 0.0–4.4)
Cholesterol: 189 mg/dL (ref 100–200)
HDL: 69 mg/dL (ref 55–72)
LDL Cholesterol: 105 mg/dL — ABNORMAL HIGH (ref 0.0–100.0)
LDL/HDL Ratio: 1.5
Triglycerides: 77 mg/dL (ref 0–149)
VLDL: 15.4 mg/dL (ref 5.0–40.0)

## 2020-01-28 LAB — CBC WITH AUTO DIFFERENTIAL
Basophils %: 0.4 % (ref 0.0–2.0)
Basophils Absolute: 0 10*3/uL (ref 0.0–0.2)
Eosinophils %: 1.6 % (ref 0.0–7.0)
Eosinophils Absolute: 0.1 10*3/uL (ref 0.0–0.5)
Hematocrit: 36.5 % — ABNORMAL LOW (ref 38.0–52.0)
Hemoglobin: 11.9 g/dL — ABNORMAL LOW (ref 13.0–17.3)
Immature Grans (Abs): 0.02 10*3/uL (ref 0.00–0.06)
Immature Granulocytes %: 0.2 % (ref 0.1–0.6)
Lymphocytes Absolute: 2.4 10*3/uL (ref 1.0–3.2)
Lymphocytes: 28.7 % (ref 15.0–45.0)
MCH: 25.4 pg — ABNORMAL LOW (ref 27.0–34.5)
MCHC: 32.6 g/dL (ref 32.0–36.0)
MCV: 78 fL — ABNORMAL LOW (ref 84.0–100.0)
MPV: 9.2 fL (ref 7.2–13.2)
Monocytes %: 8.9 % (ref 4.0–12.0)
Monocytes Absolute: 0.7 10*3/uL (ref 0.3–1.0)
NRBC Absolute: 0 10*3/uL (ref 0.000–0.012)
NRBC Automated: 0 % (ref 0.0–0.2)
Neutrophils %: 60.2 % (ref 42.0–74.0)
Neutrophils Absolute: 5 10*3/uL (ref 1.6–7.3)
Platelets: 415 10*3/uL (ref 140–440)
RBC: 4.68 x10e6/mcL (ref 4.00–5.60)
RDW: 15.3 % (ref 11.0–16.0)
WBC: 8.2 10*3/uL (ref 3.8–10.6)

## 2020-01-28 LAB — COMPREHENSIVE METABOLIC PANEL
ALT: 19 U/L (ref 0–41)
AST: 21 U/L (ref 0–40)
Albumin/Globulin Ratio: 1.8 mmol/L (ref 1.00–2.00)
Albumin: 4.3 g/dL (ref 3.5–5.2)
Alk Phosphatase: 64 U/L (ref 40–130)
Anion Gap: 10 mmol/L (ref 2–17)
BUN: 21 mg/dL (ref 8–23)
CO2: 26 mmol/L (ref 22–29)
Calcium: 9.4 mg/dL (ref 8.8–10.2)
Chloride: 104 mmol/L (ref 98–107)
Creatinine: 1 mg/dL (ref 0.7–1.3)
GFR African American: 89 mL/min/{1.73_m2} — ABNORMAL LOW (ref >=90)
GFR Non-African American: 76 mL/min/{1.73_m2} — ABNORMAL LOW (ref >=90)
Globulin: 2 g/dL (ref 1.9–4.4)
Glucose: 107 mg/dL — ABNORMAL HIGH (ref 70–99)
Osmolaliy Calculated: 283 mosm/kg (ref 270–287)
Potassium: 4.3 mmol/L (ref 3.5–5.3)
Sodium: 140 mmol/L (ref 135–145)
Total Bilirubin: 1.3 mg/dL — ABNORMAL HIGH (ref 0.00–1.20)
Total Protein: 6.7 g/dL (ref 6.4–8.3)

## 2020-01-28 LAB — PROSTATE SPECIFIC ANTIGEN, TOTAL: PSA: 7.64 ng/mL — ABNORMAL HIGH (ref 0.000–4.000)

## 2020-01-28 LAB — HEMOGLOBIN A1C
Est. Avg. Glucose, WB: 123
Est. Avg. Glucose-calculated: 133
Hemoglobin A1C: 5.9 % (ref 4.0–6.0)

## 2020-02-26 LAB — CBC WITH AUTO DIFFERENTIAL
Basophils %: 0.5 % (ref 0.0–2.0)
Basophils Absolute: 0 10*3/uL (ref 0.0–0.2)
Eosinophils %: 2.2 % (ref 0.0–7.0)
Eosinophils Absolute: 0.1 10*3/uL (ref 0.0–0.5)
Hematocrit: 37.2 % — ABNORMAL LOW (ref 38.0–52.0)
Hemoglobin: 12.1 g/dL — ABNORMAL LOW (ref 13.0–17.3)
Immature Grans (Abs): 0.01 10*3/uL (ref 0.00–0.06)
Immature Granulocytes %: 0.2 % (ref 0.1–0.6)
Lymphocytes Absolute: 2.2 10*3/uL (ref 1.0–3.2)
Lymphocytes: 34.7 % (ref 15.0–45.0)
MCH: 25.2 pg — ABNORMAL LOW (ref 27.0–34.5)
MCHC: 32.5 g/dL (ref 32.0–36.0)
MCV: 77.5 fL — ABNORMAL LOW (ref 84.0–100.0)
MPV: 9.2 fL (ref 7.2–13.2)
Monocytes %: 9.2 % (ref 4.0–12.0)
Monocytes Absolute: 0.6 10*3/uL (ref 0.3–1.0)
NRBC Absolute: 0 10*3/uL (ref 0.000–0.012)
NRBC Automated: 0 % (ref 0.0–0.2)
Neutrophils %: 53.2 % (ref 42.0–74.0)
Neutrophils Absolute: 3.4 10*3/uL (ref 1.6–7.3)
Platelets: 400 10*3/uL (ref 140–440)
RBC: 4.8 x10e6/mcL (ref 4.00–5.60)
RDW: 16.1 % — ABNORMAL HIGH (ref 11.0–16.0)
WBC: 6.3 10*3/uL (ref 3.8–10.6)

## 2020-02-26 LAB — IRON AND TIBC
Iron % Saturation: 10 % — ABNORMAL LOW (ref 20–40)
Iron: 36 ug/dL — ABNORMAL LOW (ref 59–158)
TIBC: 378 ug/dL (ref 250–450)
UIBC: 342 ug/dL (ref 112.0–347.0)

## 2020-02-26 LAB — VITAMIN B12: Vitamin B-12: 656 pg/mL (ref 232–1245)

## 2020-02-27 LAB — FOLATE RBC
Folate, Hemolysate: 541 ng/mL
Hematocrit: 39.2 % (ref 37.5–51.0)
RBC Folate: 1380 ng/mL (ref >498)

## 2020-06-23 LAB — PROSTATE SPECIFIC ANTIGEN, TOTAL: PSA: 8.83 ng/mL — ABNORMAL HIGH (ref 0.000–4.000)

## 2020-08-26 LAB — HEMOGLOBIN A1C
Est. Avg. Glucose, WB: 120
Est. Avg. Glucose-calculated: 129
Hemoglobin A1C: 5.8 % (ref 4.0–6.0)

## 2020-08-26 LAB — CBC WITH AUTO DIFFERENTIAL
Basophils %: 0.5 % (ref 0.0–2.0)
Basophils Absolute: 0 10*3/uL (ref 0.0–0.2)
Eosinophils %: 3.2 % (ref 0.0–7.0)
Eosinophils Absolute: 0.3 10*3/uL (ref 0.0–0.5)
Hematocrit: 41.8 % (ref 38.0–52.0)
Hemoglobin: 14 g/dL (ref 13.0–17.3)
Immature Grans (Abs): 0.02 10*3/uL (ref 0.00–0.06)
Immature Granulocytes %: 0.2 % (ref 0.1–0.6)
Lymphocytes Absolute: 2.5 10*3/uL (ref 1.0–3.2)
Lymphocytes: 30.5 % (ref 15.0–45.0)
MCH: 27.2 pg (ref 27.0–34.5)
MCHC: 33.5 g/dL (ref 32.0–36.0)
MCV: 81.3 fL — ABNORMAL LOW (ref 84.0–100.0)
MPV: 9.2 fL (ref 7.2–13.2)
Monocytes %: 11.1 % (ref 4.0–12.0)
Monocytes Absolute: 0.9 10*3/uL (ref 0.3–1.0)
NRBC Absolute: 0 10*3/uL (ref 0.000–0.012)
NRBC Automated: 0 % (ref 0.0–0.2)
Neutrophils %: 54.5 % (ref 42.0–74.0)
Neutrophils Absolute: 4.4 10*3/uL (ref 1.6–7.3)
Platelets: 420 10*3/uL (ref 140–440)
RBC: 5.14 x10e6/mcL (ref 4.00–5.60)
RDW: 14.7 % (ref 11.0–16.0)
WBC: 8 10*3/uL (ref 3.8–10.6)

## 2020-08-27 LAB — LIPID PANEL
Chol/HDL Ratio: 3 (ref 0.0–4.4)
Cholesterol: 217 mg/dL — ABNORMAL HIGH (ref 100–200)
HDL: 73 mg/dL (ref >=40)
LDL Cholesterol: 127.2 mg/dL — ABNORMAL HIGH (ref 0.0–100.0)
LDL/HDL Ratio: 1.7
Triglycerides: 84 mg/dL (ref 0–149)
VLDL: 16.8 mg/dL (ref 5.0–40.0)

## 2020-08-27 LAB — COMPREHENSIVE METABOLIC PANEL
ALT: 25 U/L (ref 0–41)
AST: 27 U/L (ref 0–40)
Albumin/Globulin Ratio: 1.7 mmol/L (ref 1.00–2.70)
Albumin: 4.6 g/dL (ref 3.5–5.2)
Alk Phosphatase: 71 U/L (ref 40–130)
Anion Gap: 15 mmol/L (ref 2–17)
BUN: 20 mg/dL (ref 8–23)
CO2: 22 mmol/L (ref 22–29)
Calcium: 9.4 mg/dL (ref 8.8–10.2)
Chloride: 104 mmol/L (ref 98–107)
Creatinine: 0.9 mg/dL (ref 0.7–1.3)
GFR African American: 100 mL/min/{1.73_m2} (ref >=90)
GFR Non-African American: 86 mL/min/{1.73_m2} — ABNORMAL LOW (ref >=90)
Globulin: 3 g/dL (ref 1.9–4.4)
Glucose: 99 mg/dL (ref 70–99)
Osmolaliy Calculated: 284 mosm/kg (ref 270–287)
Potassium: 4.6 mmol/L (ref 3.5–5.3)
Sodium: 141 mmol/L (ref 135–145)
Total Bilirubin: 1.3 mg/dL — ABNORMAL HIGH (ref 0.00–1.20)
Total Protein: 7.3 g/dL (ref 6.4–8.3)

## 2020-08-27 LAB — IRON AND TIBC
Iron % Saturation: 29 % (ref 20–40)
Iron: 114 ug/dL (ref 59–158)
TIBC: 389 ug/dL (ref 250–450)
UIBC: 275 ug/dL (ref 112.0–347.0)

## 2020-08-27 LAB — FOLATE RBC
Folate, Hemolysate: >620 ng/mL
Hematocrit: 43.4 % (ref 37.5–51.0)
RBC Folate: >1429 ng/mL (ref >498)

## 2020-08-27 LAB — VITAMIN B12: Vitamin B-12: 678 pg/mL (ref 232–1245)

## 2020-09-03 NOTE — Nursing Note (Signed)
Adult Admission Assessment - Text       Perioperative Admission Assessment Entered On:  09/03/2020 16:05 EST    Performed On:  09/03/2020 16:05 EST by Mendel Ryder, RN, [place]               General   Call Complete :   09/03/2020 16:25 EST   Height/Length Estimated :   177.8 cm(Converted to: 70.00 in)    Weight   Estimated :   95.5 kg(Converted to: 210.541 lb)    Body Mass Index Estimated :   30.21 kg/m2   PAT Patient Procedure Verification :   Patient name and DOB confirmed with patient, Correct procedure scheduled confirmed with patient, Correct side/site confirmed with patient   Day of Proc Supp Prsn is the Emerg Cont :   Yes   Day of Procedure Support Person Name :   CHRIS Ewald   Day of Procedure Support Person Phone :   WILL PROVIDE DOS   Day of Procedure Support Person Relationship :   SPOUSE   Languages :   Morrie Sheldon, RN, [place] - 09/03/2020 16:08 EST   Call Start :   09/03/2020 16:05 EST   Mendel Ryder, RN, STACI - 09/03/2020 16:05 EST   Information Given By :   Self         Primary Care Physician/Specialists :   DR. Jess Barters- PCP   Mendel Ryder, RN, [place] - 09/03/2020 16:08 EST     Allergies   (As Of: 09/09/2020 07:15:45 EST)   Allergies (Active)   No Known Allergies  Estimated Onset Date:   Unspecified ; Comments:     Comment 1: q   ; Created By:   Joellyn Rued; Reaction Status:   Active ; Category:   Drug ; Substance:   No Known Allergies ; Type:   Allergy ; Updated By:   Joellyn Rued; Reviewed Date:   09/09/2020 7:12 EST        Medication History   Medication List   (As Of: 09/09/2020 07:15:45 EST)   Normal Order    sodium chloride 0.9% Inj Soln 10 mL syringe  :   sodium chloride 0.9% Inj Soln 10 mL syringe ; Status:   Ordered ; Ordered As Mnemonic:   sodium chloride 0.9% flush syringe range dose ; Simple Display Line:   30 mL, IV Push, q8hr ; Ordering Provider:   Lu Duffel C-MD; Catalog Code:   sodium chloride flush ; Order Dt/Tm:   09/09/2020 07:09:09 EST          A Patient Specific Medication  :   A  Patient Specific Medication ; Status:   Ordered ; Ordered As Mnemonic:   A Patient Specific Medication ; Simple Display Line:   1 EA, Kit-Combo, q63mn, PRN: other (see comment) ; Ordering Provider:   HLu DuffelC-MD; Catalog Code:   A Patient Specific Medication ; Order Dt/Tm:   09/09/2020 07:09:09 EST          A Patient Specific Refrigerated Medication  :   A Patient Specific Refrigerated Medication ; Status:   Ordered ; Ordered As Mnemonic:   A Patient Specific Refrigerated Medication ; Simple Display Line:   1 EA, Kit-Combo, q571m, PRN: other (see comment) ; Ordering Provider:   HALu Duffel-MD; Catalog Code:   A Patient Specific Refrigerated Medicati ; Order Dt/Tm:   09/09/2020 07:09:09 EST ; Comment:   to access the patient specific Refrigerated medications  Delivery and Return [place] Access  :   Delivery and Return [employer] ; Status:   Ordered ; Ordered As Mnemonic:   Delivery and Return Bin Access ; Simple Display Line:   1 EA, Kit-Combo, q8mn, PRN: other (see comment) ; Ordering Provider:   HLu DuffelC-MD; Catalog Code:   Delivery and Return Bin Access ; Order Dt/Tm:   09/09/2020 07:09:09 EST ; Comment:   This code grants access to the A[employer] Energyfor the Delivery and Return Bin Access          lidocaine 1% PF Inj Soln 2 mL  :   lidocaine 1% PF Inj Soln 2 mL ; Status:   Ordered ; Ordered As Mnemonic:   lidocaine 1% preservative-free injectable solution ; Simple Display Line:   0.25 mL, ID, q590m, PRN: other (see comment) ; Ordering Provider:   HALu Duffel-MD; Catalog Code:   lidocaine ; Order Dt/Tm:   09/09/2020 07:09:09 EST ; Comment:   to access lidocaine 1%  2 mL vial for IV start and Life Port access          lidocaine 2% Topical Gel with applicator 1067-61L  :   lidocaine 2% Topical Gel with applicator 1095-09L ; Status:   Ordered ; Ordered As Mnemonic:   Uro-Jet 2% topical gel with applicator ; Simple Display Line:   1 app, Topical, q5m48m PRN: other (see  comment) ; Ordering Provider:   HALLu DuffelMD; Catalog Code:   lidocaine topical ; Order Dt/Tm:   09/09/2020 07:09:09 EST          Respiratory MDI Treatment  :   Respiratory MDI Treatment ; Status:   Ordered ; Ordered As Mnemonic:   Respiratory MDI Treatment ; Simple Display Line:   1 EA, Kit-Combo, q5mi66mPRN: other (see comment) ; Ordering Provider:   HALLLu DuffelD; Catalog Code:   Respiratory MDI Treatment ; Order Dt/Tm:   09/09/2020 07:09:09 EST          sodium chloride 0.9% Inj Soln 10 mL syringe  :   sodium chloride 0.9% Inj Soln 10 mL syringe ; Status:   Ordered ; Ordered As Mnemonic:   sodium chloride 0.9% flush syringe range dose ; Simple Display Line:   30 mL, IV Push, q5min81mRN: other (see comment) ; Ordering Provider:   HALL,Lu Duffel; Catalog Code:   sodium chloride flush ; Order Dt/Tm:   09/09/2020 07:09:09 EST          sodium chloride 0.9% Inj Soln 10 mL vial PF  :   sodium chloride 0.9% Inj Soln 10 mL vial PF ; Status:   Ordered ; Ordered As Mnemonic:   sodium chloride 0.9% vial for reconstitution range dose ; Simple Display Line:   30 mL, IV Push, q5min,49mN: other (see comment) ; Ordering Provider:   HALL, Lu Duffel Catalog Code:   sodium chloride flush ; Order Dt/Tm:   09/09/2020 07:09:09 EST ; Comment:   for access to sodium chloride 0.9% vial when needed as a diluent for reconstitutable medications          sterile water Inj Soln 10 mL  :   sterile water Inj Soln 10 mL ; Status:   Ordered ; Ordered As Mnemonic:   sterile water for reconstitution ; Simple Display Line:   10 mL, N/A, q5min, 79m: other (see comment) ; Ordering Provider:   HALL,  Lu Duffel  Catalog Code:   sterile water for reconstitution ; Order Dt/Tm:   09/09/2020 07:09:09 EST ; Comment:   Access sterile water when needed as a diluent for reconstitutable medications. Not for IV use.          Lactated Ringers Injection IV Sol 500 mL  :   Lactated Ringers Injection IV Sol 500 mL ; Status:   Ordered ;  Ordered As Mnemonic:   Lactated Ringer Bolus ; Simple Display Line:   500 mL, IV Bolus, Once ; Ordering Provider:   Pricilla Holm; Catalog Code:   Lactated Ringers Injection ; Order Dt/Tm:   05/04/2016 10:26:48 EDT            Home Meds    multivitamin  :   multivitamin ; Status:   Documented ; Ordered As Mnemonic:   Multiple Vitamins oral tablet ; Simple Display Line:   1 tabs, Oral, Daily, 30 tabs, 0 Refill(s) ; Catalog Code:   multivitamin ; Order Dt/Tm:   05/04/2016 06:52:33 EDT            Problem History   (As Of: 09/09/2020 07:15:45 EST)   Problems(Active)    Incarcerated umbilical hernia (SNOMED CT  :967893810 )  Name of Problem:   Incarcerated umbilical hernia ; Recorder:   SIMPSON,  DAMON-MD; Confirmation:   Confirmed ; Classification:   Medical ; Code:   [id number] ; Contributor System:   Conservation officer, nature ; Last Updated:   05/04/2016 10:24 EDT ; Life Cycle Status:   Active ; Responsible Provider:   Joellyn Rued; Vocabulary:   SNOMED CT        Wears glasses (SNOMED CT  :277824235 )  Name of Problem:   Wears glasses ; Recorder:   Melina Modena, RN, Rande Brunt; Confirmation:   Confirmed ; Classification:   Patient Stated ; Code:   [id number] ; Contributor System:   Conservation officer, nature ; Last Updated:   03/02/2017 15:39 EDT ; Life Cycle Date:   03/02/2017 ; Life Cycle Status:   Active ; Vocabulary:   SNOMED CT          Procedure History        -    Procedure History   (As Of: 09/09/2020 07:15:45 EST)     Procedure Dt/Tm:   2011 ; Anesthesia Minutes:   0 ; Procedure Name:   Colonoscopy ; Procedure Minutes:   0 ; Last Reviewed Dt/Tm:   09/09/2020 07:14:35 EST            Procedure Dt/Tm:   05/04/2016 08:26:00 EDT ; Location:   [facility] ; Provider:   Joellyn Rued; Anesthesia Type:   General ; :   Pricilla Holm; Anesthesia Minutes:   0 ; Procedure Name:   Hernia Repair Ventral or Incisional Robotic Assisted ; Procedure Minutes:   107 ; Comments:     05/04/2016 10:20 EDT - Wess Botts, RN, KELLY  auto-populated from documented surgical  case ; Clinical Service:   Surgery ; Last Reviewed Dt/Tm:   09/09/2020 07:14:35 EST            Procedure Dt/Tm:   03/06/2017 13:06:00 EDT ; Location:   [facility] ; Provider:   Joanette Gula; Anesthesia Type:   Monitored Anesthesia Care ; :   Laretta Alstrom R; Anesthesia Minutes:   0 ; Procedure Name:   Colonoscopy ; Procedure Minutes:   14 ; Comments:     03/06/2017 13:31 EDT - Nelda Marseille, RN, Hollace Hayward  A  auto-populated from documented surgical case ; Clinical Service:   Surgery ; Last Reviewed Dt/Tm:   09/09/2020 07:14:35 EST            History Confirmation   Problem History Changes PAT :   No   Procedure History Changes PAT :   No   Garald Braver, RN, [place] A - 09/09/2020 7:12 EST   Anesthesia/Sedation   Shortness of Breath Indicator :   No shortness of breath   Garald Braver, RN, [place] A - 09/09/2020 7:12 EST   Anesthesia History :   Prior general anesthesia   SN - Malignant Hyperthermia :   Denies   Previous Problem with Anesthesia :   None   Moderate Sedation History :   Prior sedation for procedure   Previous Problem With Sedation :   None   Symptoms of Sleep Apnea :   Age greater than 60, Male Gender   Symptoms of Sleep Apnea Score (STOP BANG) :   2    Pregnancy Status :   N/A   LINDSEY, RN, [place] - 09/03/2020 16:08 EST   Bloodless Medicine   Is Blood Transfusion Acceptable to Patient :   Yes   LINDSEY, RN, [place] - 09/03/2020 16:08 EST   ID Risk Screen Symptoms   Recent Travel History :   No recent travel   Close Contact with COVID-19 ID :   Preadmission testing patients only   Last 14 days COVID-19 ID :   No   TB Symptom Screen :   No symptoms   C. diff Symptom/History ID :   Neither of the above   LINDSEY, RN, [place] - 09/03/2020 16:08 EST   ID COVID-19 Screen   Fever OR Chills :   No   New or Worsening Cough :   No   Shortness of Breath ID :   No   Dyspnea :   No   Sore Throat :   No   Laryngitis :   No   LINDSEY, RN, [place] - 09/03/2020 16:08 EST   Social History   Social History   (As Of: 09/09/2020  07:15:46 EST)   Tobacco:        Never smoker   (Last Updated: 07/26/2016 07:27:35 EDT by Donzetta Matters, RN, [place] Hug[place])          Electronic Cigarette/Vaping:        Never Electronic Cigarette Use.   (Last Updated: 09/03/2020 16:11:27 EST by Mendel Ryder, RN, [place])          Alcohol:        Current, Beer, Wine, 1-2 times per month   (Last Updated: 03/02/2017 15:41:45 EDT by Melina Modena, RN, Rande Brunt)          Substance Use:        Denies   (Last Updated: 05/04/2016 06:56:50 EDT by Milderd Meager, RN, Candiss Norse)            Advance Directive   Location of Advance Directive :   Family to bring in copy from home   Reason Advance Directive Not Obtained :   pt did not bring copy   Garald Braver, RN, [place] A - 09/09/2020 7:12 EST   Advance Directive :   Yes   Type of Advance Directive :   Living will, Medical durable power of attorney   Mendel Ryder, RN, [place] - 09/03/2020 16:08 EST   Harm Screen   Suspect or Concern for: :   None   Feels Safe  Where Live :   Yes   Last 3 mo, thoughts killing self/others :   Patient denies   Garald Braver, RN, [place] A - 09/09/2020 7:12 EST

## 2020-09-09 NOTE — Nursing Note (Signed)
Nursing Discharge Summary - Text       Nursing Discharge Summary Entered On:  09/09/2020 9:51 EST    Performed On:  09/09/2020 9:50 EST by HAUFF, RN, LISA               DC Information   Discharge To :   Home independently   Mode of Discharge :   Wheelchair   Transportation :   Private vehicle   Accompanied By :   Annamarie Dawley, RN, LISA - 09/09/2020 9:50 EST

## 2020-09-09 NOTE — Anesthesia Post-Procedure Evaluation (Signed)
Postanesthesia Evaluation        Patient:   [name], Wayne [name]            MRN: [id number]            FIN: [id number]               Age:   69 years     Sex:  Male     DOB:  Dec 19, 1950   Associated Diagnoses:   None   Author:   Gillis Ends K-MD      Postoperative Information   Post Operative Info:          Post operative day: Post Anesthesia Care Unit.         Patient location: PACU.       Assessment   Postanesthesia assessment   Vitals: Vital Signs   09/09/2020 7:20 EST Systolic Blood Pressure 136 mmHg    Diastolic Blood Pressure 76 mmHg    Temperature Oral 37 degC    Heart Rate Monitored 74 bpm    Respiratory Rate 20 br/min    SpO2 98 %    SBP/DBP Cuff Details Right arm      .     Respiratory function: Respiratory rate, airway, and oxygen saturation are at adequate levels.     Cardiovascular function: Heart Rate stable, Blood Pressure stable, Postoperative hydration status Adequate.     Mental status: appropriate for level of anesthesia.     Temperature: within normal limits.     Pain Control: Adequate.     Nausea/Vomiting: Absent.     Signature Line     Electronically Signed on 09/09/2020 09:38 AM EST   ________________________________________________   Gillis Ends K-MD

## 2020-09-09 NOTE — Op Note (Signed)
Phase II Record - [facility]             Phase II Record - [facility] Summary                                                                  Primary Physician:        Lindell Noe C-MD    Case Number:              [id number]    Finalized Date/Time:      09/09/20 11:01:09    Pt. Name:                 [name], Wayne    D.O.B./Sex:               [birth year]    Male    Med Rec #:                [id number]    Physician:                Lindell Noe C-MD    Financial #:              [id number]    Pt. Type:                 S    Room/Bed:                 /    Admit/Disch:              09/09/20 06:37:00 -    Institution:       [facility] Case Attendance - Phase II                                                                                           Entry 1                                                                                                          Case Attendee             HAUFF, RN, LISA                 Role Performed                  Phase II Nurse    Time In                   09/09/20 10:05:00  Time Out                        09/09/20 10:35:00    Last Modified By:         HAUFF, RN, LISA                              09/09/20 11:00:58      [facility] Case Attendance - Phase II Audit                                                            09/09/20 11:00:58         Owner: HAUFLI                               Modifier: HAUFLI                                                            1     <+> Case Attendee            1     <+> Role Performed            1     <*> Time In                                09/09/20 10:05:00            1     <*> Time Out                               09/09/20 10:35:00            2     <-> Case Attendee                          HAUFF, RN, LISA            2     <-> Role Performed                         Phase II Nurse            2     <-> Time In                                09/09/20 10:05:00            2     <-> Time Out                               09/09/20 10:35:00     09/09/20  11:00:26         Owner: HAUFLI  Modifier: HAUFLI                                                        <+> 2         Case Attendee        <+> 2         Role Performed        <+> 2         Time In        <+> 2         Time Out     09/09/20 11:00:03         Owner: HAUFLI                               Modifier: HAUFLI                                                        Entry 1 was deleted.  Higher numbered entries shifted one position to fill the gap.        <-> 1         Case Attendee                          HAUFF, RN, LISA        <-> 1         Role Performed                         Phase II Nurse        <-> 1         Time In        <-> 1         Time Out     09/09/20 10:58:37         Owner: HAUFLI                               Modifier: HAUFLI                                                        <+> 2         Time In        <+> 2         Time Out        [facility] - Case Times - Phase II  Entry 1                                                                                                          Phase II In               09/09/20 10:05:00               Phase II Out                    09/09/20 10:35:00    Phase II Discharge        09/09/20 10:35:00    Time     Last Modified By:         HAUFF, RN, LISA                              09/09/20 10:59:01      [facility] - Case Times - Phase II Audit                                                               09/09/20 10:59:01         Owner: HAUFLI                               Modifier: HAUFLI                                                        <+> 1         Phase II Out        <+> 1         Phase II Discharge Time                Finalized ByFelipa Eth: HAUFF, RN, LISA      Document Signatures                                                                             Signed By:           Felipa EthHAUFF, RN, LISA 09/09/20 11:01

## 2020-09-09 NOTE — Case Communication (Signed)
Discharge Follow-Up Form - Text       Discharge Follow-Up Entered On:  09/16/2020 9:31 EST    Performed On:  09/16/2020 9:31 EST by Dorita Fray, RN, Frederik Schmidt               Clinical   Provider Follow-Up Post Discharge RTF :   Follow-Up Appointments    With:  Await pathology results       Morey Hummingbird - 09/16/2020 9:31 EST   Status   Case Status :   Completed   Reason for Removal :   Over 10 days from discharge   Morey Hummingbird - 09/16/2020 9:31 EST

## 2020-09-09 NOTE — Procedures (Signed)
IntraOp Record - [facility]             IntraOp Record - [facility] Summary                                                                   Primary Physician:        Lu Duffel C-MD    Case Number:              [id number]    Finalized Date/Time:      09/09/20 09:38:03    Pt. Name:                 Wayne [name], Wayne [name]    D.O.B./Sex:               June 12, 1951    Male    Med Rec #:                [id number]    Physician:                Lu Duffel C-MD    Financial #:              [id number]    Pt. Type:                 S    Room/Bed:                 /    Admit/Disch:              09/09/20 06:37:00 -    Institution:       [facility] - Case Times                                                                                                         Entry 1                                                                                                          Patient      In Room Time             09/09/20 09:05:00               Out Room Time                   09/09/20 09:31:00    Anesthesia     Procedure  Start Time               09/09/20 09:15:00               Stop Time                       09/09/20 09:29:00    Last Modified By:         Tracey Harries                              S-RN 09/09/20 09:37:53      [facility] - Case Times Audit                                                                          09/09/20 09:37:53         Owner: [id number]                              Modifier: [id number]                                                           1     <*> Out Room Time                          09/09/20 09:32:00     09/09/20 09:37:37         Owner: [id number]                              Modifier: [id number]                                                       <+> 1         Out Room Time     09/09/20 09:29:13         Owner: [id number]                              Modifier: [id number]                                                       <+> 1         Stop Time        [facility] - Case Attendance  Entry 1                         Entry 2                         Entry 3                                          Case Attendee             Lala Lund,  CATHERINE K-MD          [place] Crane,  STANLEY C-MD             Derry Lory    Role Performed            Anesthesiologist                Surgeon Primary                 Surgical Scrub    Time In                   09/09/20 09:05:00               09/09/20 09:05:00               09/09/20 09:05:00    Time Out                  09/09/20 09:32:00               09/09/20 09:32:00               09/09/20 09:32:00    Procedure                 Prostate Needle Biopsy          Prostate Needle Biopsy          Prostate Needle Biopsy                              Transrectal                     Transrectal                     Transrectal    Last Modified By:         Margo Aye                              [facility] 09/09/20 09:37:58          S-RN 09/09/20 09:37:58          S-RN 09/09/20 09:37:58                                Entry 4                         Entry 5  Case Attendee             Geraldo Docker             Tracey Harries                                                              S-RN    Role Performed            Surgical Scrub Add'l            Circulator    Time In                   09/09/20 09:05:00               09/09/20 09:05:00    Time Out                  09/09/20 09:32:00               09/09/20 09:32:00    Procedure                 Prostate Needle Biopsy          Prostate Needle Biopsy                              Transrectal                     Transrectal    Last Modified By:         Dorette Grate                              [facility] 09/09/20 09:37:58          S-RN 09/09/20 09:37:58      [facility] - Case Attendance Audit                                                                      09/09/20 09:37:58         Owner: [id number]                              Modifier: [id number]                                                           1     <+> Time Out            1     <*> Procedure  Prostate Needle Biopsy Transrectal            2     <+> Time Out            2     <*> Procedure                              Prostate Needle Biopsy Transrectal            3     <+> Time In            3     <+> Time Out            3     <*> Procedure                              Prostate Needle Biopsy Transrectal            4     <+> Time In            4     <+> Time Out            4     <*> Procedure                              Prostate Needle Biopsy Transrectal            5     <+> Time In            5     <+> Time Out            5     <*> Procedure                              Prostate Needle Biopsy Transrectal     09/09/20 09:18:23         Owner: [id number]                              Modifier: [id number]                                                           1     <+> Time In            1     <*> Procedure                              Prostate Needle Biopsy Transrectal            2     <+> Time In            2     <*> Procedure                              Prostate Needle Biopsy Transrectal        <+> 3         Case Attendee        <+>  3         Role Performed        <+> 3         Procedure        <+> 4         Case Attendee        <+> 4         Role Performed        <+> 4         Procedure        <+> 5         Case Attendee        <+> 5         Role Performed        <+> 5         Procedure        [facility] - Skin Assessment                                                                          Pre-Care Text:            A.240 Assesses baseline skin condition Im.120 Implements protective measures to prevent skin or tissue injury           due to mechanical sources  Im.280.1 Implements progective measures to prevent skin or tissue injury due to            thermal sources Im.360 Monitors for signs and symptons of infection                              Entry 1                                                                                                          Skin Integrity            Intact    Last Modified By:         Tracey Harries                              S-RN 09/09/20 09:20:49    Post-Care Text:            E.10 Evaluates for signs and symptoms of physical injury to skin and tissue E.270 Evaluate tissue perfusion           O.60 Patient is free from signs and symptoms of injury caused by extraneous objects   O.210 Patinet's tissue           perfusion is consistent with or improved from baseline levels      [facility] - Patient Positioning  Pre-Care Text:            A.240 Assesses baseline skin condition A.280 Identifies baseline musculoskeletal status A.280.1 Identifies           physical alterations that require additional precautions for procedure-specific positioning A.510.8 Maintains           patient's dignity and privacy Im.120 Implements protective measures to prevent skin/tissue injury due to           mechanical sources Im.40 Positions the patient Im.80 Applies safety devices                              Entry 1                                                                                                          Procedure                 Prostate Needle Biopsy          Body Position                   Lateral Right Up                              Transrectal    Left Arm Position         Flexed                          Right Arm Position              Flexed    Left Leg Position         Flexed                          Right Leg Position              Flexed, Pillow Under                                                                                              Knee    Feet Uncrossed            Yes                             Pressure Points                 Yes  Checked     Additional                Patient on Banker, Safety Strap    Information               Bed with Left Side Rail                              up.  Position is                              Lateral with Right side                              Up.  Pillow between                              patient arms and legs.    Positioned By             Tracey Harries            Outcome Met (O.80)              Yes                              S-RN, Ward Chatters K-MD,                              Lu Duffel C-MD    Last Modified By:         Tracey Harries                              [facility] 09/09/20 09:20:21    Post-Care Text:            A.240 Assesses baseline skin condition A.280 Identifies baseline musculoskeletal status A.280.1 Identifies           physical alterations that require additional precautions for procedure-specific positioning A.510.8 Maintains           patient's dignity and privacy Im.120 Implements protective measures to prevent skin/tissue injury due to           mechanical sources Im.40 Positions the patient Im.80 Applies safety devices      [facility] - General Case Data                                                                        Pre-Care Text:            A.350.1 Classifies surgical wound  Entry 1                                                                                                          Case Information      ASA Class                1                               Case Level                      Level 2     OR                       RH7 07                          Specialty                       Urology (SN)     Wound Class              2-Clean-Contaminated    Preop Diagnosis           R97.2                           Postop Diagnosis                R97.2    Last Modified By:         Tracey Harries                              S-RN 09/09/20 09:18:31    Post-Care Text:            O.760 Patient receives consistent and comparable care regardless of the setting      [place] Assessment                                                                                               Entry 1  Fire Risk                 N/A                             Fire Risk Score                 0-1    Assessment: If     checked, checkmark     = 1 point     Fire Risk Protocol     (Reference Only)      0-1                      If using alcohol based                              solutions prep, use the                              minimal amount needed.,                              Allow sufficient drying                              time per manufacturers                              recommendations to                              allow the dissipation                              of fumes., Do not drape                              until the prep area is                              fully dry., Do not                              allow pooling of any                              prep solution                              (including under the                              patient)., Remove all                              bowls of volatile  solutions and saturated                              prep materials from the                              field after use.,                              Utilize standard                              draping procedure.,                              Protect all heat                              sources when not in use                              (cautery pencil                              holster, laser in                              stand-by mode, etc).,                              Activate heat source                              only when active  tip is                              in line of sight and in                              contact with tissue.    Last Modified By:         Tracey Harries                              S-RN 09/09/20 09:19:19      RH[facility] - Time Out                                                                                 Pre-Care Text:            A.10 Confirms patient identity A.20 Verifies operative procedure, surgical site, and laterality A.20.1 Verifies  consent for planned procedure A.30 Verifies allergies                              Entry 1                                                                                                          Procedure                 Prostate Needle Biopsy          Patient name and                Yes                              Transrectal                     DOB confirmed.                                                               Allergies                                                               confirmed. Surgical                                                               procedure to be                                                               performed confirmed                                                               and verified by  completed surgical                                                               consent     Surgical site             Yes                             Essential imaging,              Yes    confirmed. Correct                                        required blood     surgical site                                             products, implants,     marked and initials                                       devices and/or     are visible through                                       special equipment     prepped and draped                                        available and     field (or                                                 sterilization      alternative ID band                                       indicators confirmed     used), if applicable     Surgeon shares            Yes                             Anesthesia shares               Yes    operative plan,                                           anesthetic plan and     anticipated  reviews patient     specimens                                                 specific concerns     discussed, possible                                       and confirms     difficulties,                                             administration of     expected duration,                                        antibiotics, if     anticipated blood                                         applicable     loss and reviews     all     critical/specific     concerns     Fire risk                 Yes                             Surgeon states:                 Yes    assessment scored                                         Does anyone have     and plan discussed                                        any concerns? If                                                               you see, suspect,                                                               or feel that  patient care is                                                               being compromised,                                                               speak up for                                                               patient safety     Time Out Complete         09/09/20 09:14:00    Last Modified By:         Tracey Harries                              S-RN 09/09/20 09:20:37    Post-Care Text:            E.30 Evaluates verification process for correct patient, site, side, and level surgery      [facility] - Debrief                                                                                  Pre-Care Text:            Im.330 Manages specimen  handling and disposition                              Entry 1                                                                                                          Procedure                 Prostate Needle Biopsy          Final counts                    Yes  Transrectal                     correct and                                                               verbally verified                                                               with                                                               surgeon/licensed                                                               independent                                                               practitioner (if                                                               applicable)     Actual procedure          Yes                             Postop diagnosis                Yes    performed confirmed                                       confirmed     Wound                     Yes                             Confirm specimens               Yes    classification  and specimens     confirmed                                                 labeled                                                               appropriately (if                                                               applicable)     Foley catheter            Yes                             Patient recovery                Yes    removed (if                                               plan confirmed     applicable)     Debrief Complete          09/09/20 09:28:00    Last Modified By:         Tracey Harries                              S-RN 09/09/20 09:29:33    Post-Care Text:            E.800 Ensures continuity of care E.50 Evaluates results of the surgical count O.30 Patient's procedure is           performed on the correct site, side, and level O.50 patient's current status is communicated throughout the            continuum of care O.40 Patient's specimen(s) is managed in the appropriate manner      [facility] - Debrief Audit                                                                             09/09/20 09:29:33         Owner: [id number]                              Modifier: [id number]  1     <*> Procedure                              Prostate Needle Biopsy Transrectal            1     <+> Debrief Complete        [facility] - Patient Care Devices                                                                     Pre-Care Text:            A.200 Assesses risk for normothermia regulation A.40 Verifies presence of prosthetics or corrective devices           Im.280 Implements thermoregulation measures Im.60 Uses supplies and equipment within safe parameters                              Entry 1                                                                                                          Equipment Type            MACHINE SEQUENTIAL              SCD Sleeve Site                 Legs Bilateral                              COMPRESSION    Equipment/Tag Number      [id number]                       Initiated Pre                   Yes                                                              Induction     Last Modified By:         Tracey Harries                              S-RN 09/09/20 09:19:31    Post-Care Text:            E.10 Evaluates signs and symptoms of physical injury to skin and tissue O.60 Patient is  free from signs and           symptoms of injury caused by extraneous objects      [facility] - Specimens                                                                                                          Entry 1                         Entry 2                         Entry 3                                          Description               LA                              LM                              LLM    Specimen Type             Routine                          Routine                         Routine    Date/Time in     Formalin (for     breast tissue only)     Last Modified By:         Margo Aye                              [facility] 09/09/20 09:24:07          S-RN 09/09/20 09:24:07          S-RN 09/09/20 09:24:07                                Entry 4                         Entry 5                         Entry 6  Description               LLB                             RA                              RM    Specimen Type             Routine                         Routine                         Routine    Date/Time in     Formalin (for     breast tissue only)     Last Modified By:         Margo Aye                              [facility] 09/09/20 09:24:07          S-RN 09/09/20 09:24:07          S-RN 09/09/20 09:24:07                                Entry 7                         Entry 8                         Entry 9                                          Description               RB                              RLM                             RLB    Specimen Type             Routine                         Routine                         Routine    Date/Time in     Formalin (for     breast tissue only)     Last Modified By:         Margo Aye  S-RN 09/09/20 09:24:07          S-RN 09/09/20 09:24:07          S-RN 09/09/20 09:24:07      [facility] - Communication                                                                            Pre-Care Text:            A.520 Identifies barriers to communication (Patient and Family Communications) A.20 Verifies operative           procedure, surgical site, and laterality Education officer, museum) Im.500 Provides status reports to family            members Im.150 Develops individualized plan of care                              Entry 1                                                                                                          Communication             Face to Face                    Communication By                Tracey Harries                                                                                              S-RN    Date and Time             09/09/20 08:55:00               Communication To                wife at bedside in preop    Last Modified By:         Tracey Harries                              S-RN 09/09/20 09:25:43    Post-Care Text:            E.520 Evaluates psychosocial response to plan of care O.500 Patient or designated support person demonstrates  knowledge of the expected psychosocial responses to the procedure E.800 Ensures continuity of care O.50           Patient's current status is communicated throughout the continuum of care      [facility] - Procedures                                                                               Pre-Care Text:            A.20 Verifies operative procedure, surgical site, and laterality Im.150 Develops individualized plan of care                              Entry 1                                                                                                          Procedure     Description      Procedure                Prostate Needle Biopsy          Surgical Procedure              TRANSRECTAL BK                              Transrectal                     Text                            ULTRASOUND  WITH                                                                                              SATURATION PROSTATE  BIOPSY    Primary Procedure         Yes                             Primary Surgeon                 Lu Duffel C-MD    Start                     09/09/20  09:15:00               Stop                            09/09/20 09:29:00    Anesthesia Type           General                         Surgical Service                Urology (SN)    Wound Class               2-Clean-Contaminated    Last Modified By:         Tracey Harries                              [facility] 09/09/20 09:37:39    Post-Care Text:            O.730 The patinet's care is consistent with the individualized perioperative plan of care      [facility] - Procedures Audit                                                                          09/09/20 09:37:39         Owner: [id number]                              Modifier: [id number]                                                       <+> 1         Stop        [facility] - Transfer                                                                                                           Entry  1                                                                                                          Transferred By            Alden Benjamin K-MD,         Via                             Sara Chu                              S-RN    Post-op Destination       PACU    Skin Assessment      Condition                Intact    Last Modified By:         Tracey Harries                              S-RN 09/09/20 09:24:12      Case Comments                                                                                         <None>              Finalized By: Tracey Harries S-RN      Document Signatures                                                                             Signed By:           Tracey Harries S-RN 09/09/20 09:38

## 2020-09-09 NOTE — Assessment & Plan Note (Signed)
PreOp Record - [facility]             PreOp Record - [facility] Summary                                                                     Primary Physician:        Lindell Noe C-MD    Case Number:              [phone]    Finalized Date/Time:      09/09/20 07:52:40    Pt. Name:                 Wayne [name], Wayne [name]    D.O.B./Sex:               11/17/1950    Male    Med Rec #:                [id number]    Physician:                Lindell Noe C-MD    Financial #:              [id number]    Pt. Type:                 S    Room/Bed:                 /    Admit/Disch:              09/09/20 06:37:00 -    Institution:       [facility] Case Attendance - PreOp                                                                                              Entry 1                         Entry 2                                                                          Case Attendee             HALL,  STANLEY C-MD             Gerome Sam, RN, Wilkie Aye  A    Role Performed            Surgeon Primary                 Preoperative Nurse    Time In     Time Out     Last Modified By:         Gerome SamVan Bolhuis, RN, Lelon FrohlichKristy         Van Bolhuis, RN, [place]                              A 09/09/20 07:10:01             A 09/09/20 07:10:01      [facility] - Case Times - PreOp                                                                                                 Entry 1                                                                                                          Patient In Room Time      09/09/20 07:10:00               Nurse In Time                   09/09/20 07:10:00    Nurse Out Time            09/09/20 07:52:00               Patient Ready for               09/09/20 07:52:00                                                              Surgery/Procedure     Last Modified By:         Gerome SamVan Bolhuis, RN, Kristy                              A 09/09/20 07:52:29      [facility] - Case Times - PreOp Audit  09/09/20 07:52:29         Owner: [id number]                              Modifier: [id number]                                                       <+> 1         Patient Ready for Surgery/Procedure        <+> 1         Nurse Out Time                Finalized By: Gerome Sam, RN, Wilkie Aye A      Document Signatures                                                                             Signed By:           Gerome Sam, RN, Kristy A 09/09/20 07:52

## 2020-09-09 NOTE — Op Note (Signed)
Phase I Record - [facility]             Phase I Record - [facility] Summary                                                                   Primary Physician:        Lindell Noe C-MD    Case Number:              [id number]    Finalized Date/Time:      09/09/20 10:17:46    Pt. Name:                 Wayne [name], Wayne [name]    D.O.B./Sex:               1951/05/19    Male    Med Rec #:                [id number]    Physician:                Lindell Noe C-MD    Financial #:              [id number]    Pt. Type:                 S    Room/Bed:                 /    Admit/Disch:              09/09/20 06:37:00 -    Institution:       [facility] Case Attendance - Phase I                                                                                            Entry 1                         Entry 2                                                                          Case Attendee             HALL,  STANLEY C-MD             HAUFF, RN, LISA    Role Performed            Surgeon Primary                 Post Anesthesia Care  Nurse    Time In     Time Out     Last Modified By:         HAUFF, RN, LISA                 HAUFF, RN, LISA                              09/09/20 10:17:27               09/09/20 10:17:27      [facility] - Case Times - Phase I                                                                                               Entry 1                                                                                                          Phase I In                09/09/20 09:35:00               Phase I Out                     09/09/20 10:05:00    Phase I Discharge         09/09/20 10:05:00    Time     Last Modified By:         Felipa Eth, RN, LISA                              09/09/20 10:17:44              Finalized By: Felipa Eth, RN, LISA      Document Signatures                                                                             Signed By:           Felipa Eth, RN, LISA 09/09/20  10:17

## 2020-09-09 NOTE — Anesthesia Pre-Procedure Evaluation (Signed)
Preanesthesia Evaluation        Patient:   Wayne [name], Wayne [name]            MRN: [id number]            FIN: [id number]               Age:   69 years     Sex:  Male     DOB:  10/21/1950   Associated Diagnoses:   None   Author:   Alden Benjamin K-MD      Preoperative Information   Procedure/ Case: U/S guided prostate needle biopsies   NPO:  NPO greater than 8 hours.    Anesthesia history     Patient's history: negative.     Family's history: negative.        Review of Systems   Integumentary:  Deferred.    Ear/Nose/Mouth/Throat:  Negative.    Respiratory:  Negative.    Cardiovascular:  Negative.    Vascular:  Deferred.    Gastrointestinal:  Deferred.    Genitourinary:  Deferred.    Endocrine:  Deferred.    Musculoskeletal:  Deferred.    Neurologic:  Deferred.       Health Status   Allergies:    Allergic Reactions (Selected)  No Known Allergies   Current medications:    Home Medications (1) Active  Multiple Vitamins oral tablet 1 tabs, Oral, Daily  ,    Medications (12) Active  Scheduled: (2)  clindamycin in D5W  900 mg 50 mL, IV Piggyback, On Call  sodium chloride 0.9% Inj Soln 10 mL syringe  30 mL, IV Push, q8hr  Continuous: (1)  Lactated Ringers Injection solution 1,000 mL  1,000 mL, IV, 40 mL/hr  PRN: (9)  A Patient Specific Medication  1 EA, Kit-Combo, q35mn  A Patient Specific Refrigerated Medication  1 EA, Kit-Combo, q567m  Delivery and Return Bin Access  1 EA, Kit-Combo, q5m65m lidocaine 1% PF Inj Soln 2 mL  0.25 mL, ID, q5mi83mlidocaine 2% Topical Gel with applicator 10-147-82 1 app, Topical, q5min50mespiratory MDI Treatment  1 EA, Kit-Combo, q5min 6mdium chloride 0.9% Inj Soln 10 mL syringe  30 mL, IV Push, q5min  29mium chloride 0.9% Inj Soln 10 mL vial PF  30 mL, IV Push, q5min  s50mile water Inj Soln 10 mL  10 mL, N/A, q5min    59moblem list:    Active Problems (2)  Incarcerated umbilical hernia   Wears glasses         Histories   Past Medical History:    No active or resolved past medical history items have  been selected or recorded.   Procedure history:    Colonoscopy on 03/06/2017 at 65 Years.75Comments:  03/06/2017 13:31 EDT - SingletonNelda Marseillelis A  auto-populated from documented surgical case  Hernia Repair Ventral or Incisional Robotic Assisted on 05/04/2016 at 64 Years.12Comments:  05/04/2016 10:20 EDT - SCARBROUGWess BottsLY  auto-populated from documented surgical case  Colonoscopy ([id number] at 59 Years.13 Social History        Social & Psychosocial Habits    Alcohol  03/02/2017  Use: Current    Type: Beer, Wine    Frequency: 1-2 times per month    Substance Use  05/04/2016  Use: Denies    Tobacco  07/26/2016  Use: Never smoker    Electronic Cigarette/Vaping  09/03/2020  Electronic Cigarette Use:  Never  .     Symptoms of Sleep Apnea Score: PAT Documentation: 2                        09/03/2020 16:05 EST        Physical Examination   Vital Signs   63/09/6008 9:32 EST Systolic Blood Pressure 355 mmHg    Diastolic Blood Pressure 76 mmHg    Temperature Oral 37 degC    Heart Rate Monitored 74 bpm    Respiratory Rate 20 br/min    SpO2 98 %    SBP/DBP Cuff Details Right arm         Vital Signs (last 24 hrs)_____  Last Charted___________  Temp Oral     37 degC  (DEC 15 07:20)  Resp Rate         20 br/min  (DEC 15 07:20)  SBP      136 mmHg  (DEC 15 07:20)  DBP      76 mmHg  (DEC 15 07:20)  SpO2      98 %  (DEC 15 07:20)  Weight      96.4 kg  (DEC 15 07:20)  Height      177.8 cm  (DEC 15 07:20)  BMI      30.49  (DEC 15 07:20)     Measurements from flowsheet : Measurements   09/09/2020 7:20 EST Body Mass Index est meas 30.49 kg/m2    Body Mass Index Measured 30.49 kg/m2   09/09/2020 7:20 EST Height/Length Measured 177.8 cm    Weight Measured 96.4 kg    Weight Dosing 96.4 kg      Pain assessment:  Pain Assessment   09/09/2020 7:20 EST Numeric Rating Pain Scale 3    Numeric Pain Rating Comfort Function Goal 4    Primary Pain Location Neck    Primary Pain Laterality Left    Primary Pain Quality Aching      .    General:           Stress: No acute distress.         Appearance: Within normal limits.    Airway:          Mallampati classification: I (soft palate, fauces, uvula, pillars visible).         Thyromental Distance: Normal.         Mouth: Teeth ( Within normal limits ).    Neck:  Full range of motion.    Respiratory:  unlabored breathing.    Cardiovascular:  Normal rate, Regular rhythm.    Gastrointestinal:  Deferred.    Musculoskeletal     Deferred.     Neurologic:  Alert, Oriented.    Metabolic Equivalents in Exercise Testing:  4-7.       Review / Management   Results review:     No qualifying data available, Lab results: 09/09/2020 7:20 EST      Estimated Creatinine Clearance            88.97 mL/min  .       Assessment and Plan   American Society of Anesthesiologists#(ASA) physical status classification:  Class I.    Anesthetic Preoperative Plan     Anesthetic technique: General anesthesia.     Maintenance airway: Laryngeal mask airway.     Opioid Assessment: Opioid Na????ve.     Risks discussed: nausea, vomiting, sore throat, serious complications.     [employer]  Electronically Signed on 09/09/2020 08:56 AM EST   ________________________________________________   Alden Benjamin K-MD

## 2020-10-13 NOTE — Consults (Signed)
Consultation               St. Beau Fanny, MD  Service Date: 10/13/2020    DIAGNOSIS:  Adenocarcinoma of prostate, stage cT1c.  High volume  disease.  PSA 8.83.  Gleason's 3+4=7.    HISTORY OF PRESENT ILLNESS:  [name] is a 70 year old white male who  was noted to have a gradual rise in PSA from 7.41 in June 2020 up to  7.03 in October 2020 and up to 7.64 in May 2021.  No prior PSAs are  available.  In September 2021, PSA was at 8.83.    The patient had an MRI of the prostate back in June 2021 when PSA was  first noted to be elevated and MRI was negative.    On 09/09/2020, Dr. Margo Aye performed saturation biopsy taking  approximately 30 cores.  The gland was estimated at 50 mL.  On digital  rectal exam, he did not identify any distinct nodules.    Final pathology showed adenocarcinoma in 8 of 10 areas sampled.   Gleason score ranged from 3+3=6 to 3+4=7.  Gleason's 3+4=7 disease was  identified in the left apex, left lateral mid and right lateral base  specimens.  There was no perineural invasion.  Grade group 2 and grade  group 1 were identified.    Dr. Margo Aye discussed all treatment options with the patient and refers  him today for evaluation for radiotherapy.    Today, the patient reports good bladder function.  He empties well and  has nocturia 0-1 time.  He denies any post-void dribbling or leakage.   He is having normal bowel movements with no evidence of rectal  bleeding.  He is up to date on colonoscopy, which was done in 2018.   He was recently evaluated for iron deficiency anemia, but this was  determined to be due to him giving a "double reds" blood donations.   Since he stopped this his iron level has improved.  He is not sexually  active.  He denies any new or focal bone pain.    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  BPH  Iron deficiency anemia, improved.    PAST SURGICAL HISTORY:  Colonoscopy 2011 and 2018.  Ventral hernia  repair.  Vasectomy.  Prostate  biopsy.    MEDICATIONS:  No chronic medications.    ALLERGIES:  NKDA.    SOCIAL HISTORY:  The patient is married and retired.  He and his wife  moved here from [place] about 6 years ago to be closer to 2  daughters and their families.  The patient has never smoked and he  uses alcohol about 1 time per week.    FAMILY HISTORY:  No known malignancies.    REVIEW OF SYSTEMS:  Per history and chart review.  No recent fevers,  chills or weight loss.  No headaches or visual change.  No respiratory  complaints, chest pain or cardiac symptoms.  No nausea, vomiting or  dysphagia.  No extremity edema.  No neurologic symptoms.  No  musculoskeletal complaints.  The patient routinely exercises, but has  cut back on his walking recently due to Achilles soreness, but this is  improving.  He normally walks for exercise.    PHYSICAL EXAMINATION:  Height 70 inches.  Weight 221.2.  VITAL SIGNS:   Stable.  Well-developed, well-nourished white male.  Alert and  oriented times 3.  HEENT:  Shows sclerae  nonicteric.  EOMI.   LYMPHATIC:  No palpable lymphadenopathy of the head, neck,  supraclavicular or inguinal region.  LUNGS:  Clear.  CARDIAC:  Regular  rate and rhythm.  ABDOMEN:  Soft, nontender without palpable  hepatosplenomegaly.  No CVA or suprapubic tenderness.  EXTREMITIES:   No pitting edema of the extremities.  MUSCULOSKELETAL:  No tenderness  to palpation of the spine or pelvic bones.  NEUROLOGIC:  No focal  motor deficits.  Speech and mentation normal.  GENITOURINARY:  Shows  normal phallus.  Testicles descended bilaterally.  No palpable edema,  tenderness or masses of the testicles or scrotum.  RECTAL:  With  normal sphincter tone and mucosa intact.  Prostate is smooth with a  peripheral rim of nodularity asymmetric and firm in the left apical  periphery.  No gross blood.    IMPRESSION:  70 year old with new diagnosis of adenocarcinoma of  prostate, unfavorable risk disease based on high volume.    PLAN:  I reviewed the  indications, risks and benefits as well as acute  and long-term complications of radiotherapy in this setting.  I  explained the planning and treatment process.  I explained the various  regimens of standard fractionation, hypofractionation, stereotactic  radiotherapy and brachytherapy seed implant.  I explained the  difference in the planning and treatment delivery and timing of acute  and late effects with each of these regimens.  I briefly mentioned  proton therapy and where he could go for opinion on this treatment.  I  explained the use of Acculoc marker seeds for daily target  localization for any external beam regimen and the newer use of  SpaceOAR to decrease risk of rectal toxicity.    I explained the difference between favorable intermediate risk disease  and unfavorable intermediate risk disease and how treatment  recommendation changes between the 2 categories.  Though he has only  Gleason's 3+4=7 disease.  He does have high volume Gleason's 7 and  Gleason 6 disease throughout the gland.  Based on the number of  biopsies positive he does fall into the unfavorable risk intermediate  group.  Given that, I would recommend a 4-6 month course of  neoadjuvant and concurrent hormonal therapy with any form of  radiation.  I explained that hormonal therapy begins first and  radiation typically begins about 2 months thereafter.  We discussed  general side effects of hormonal therapy.    I explained that we no longer do brachytherapy seed implant by Dr.  Benna Dunks at [facility] does perform these if he would like to meet  with Dr. Gaynell Face.  He is interested in I will make this referral.  I  explained that whether or not radiotherapy should be combined with  some external beam radiation and/or brief course of hormonal therapy  is controversial in this setting and Dr. Gaynell Face discussed that in  more detail if the patient leans toward seed implant.    I believe is also a candidate for robotic prostatectomy.  Dr.  Margo Aye  explained the concept of extracapsular extension, which we reviewed  again in detail today.  I explained salvage options in the setting of  primary surgery versus primary radiotherapy.  I explained how age and  performance status play into the decision as to whether or not he is a  reasonable surgical candidate.    The patient is not a reasonable candidate for expectant management in  my opinion based on his pathology.    The patient was  given written information on radiotherapy.  I have  encouraged him to call if he has any questions or would like to  discuss these issues further.  I think any radiation regimen is  suitable for him with a short course of neoadjuvant and concurrent  hormonal therapy.    CMS QUALITY METRICS:  1.  Metric 110:  Flu shot 2021.  2.  Metric 130:  Medications documented.  3.  Metric 143:  Pain is 0/10.  4.  Metric 144:  Not applicable.  5.  Metric 154:  Not a fall risk.  ECOG score of zero.  6.  Metric 156:  Not applicable.  7.  Metric 226:  Not applicable.    I spent greater than 90 minutes in review of imaging and records,  evaluation of the patient with history and physical examination and  counseling the patient regarding his disease and treatment options,  post-appointment documentation and coordination of care.      Elita Quick, MD  TR: tn DD: 10/13/2020 16:06 TD: 10/13/2020 16:19 Job#: [id number]  \\[id number]#: [id number]  \\[id number]      cc:  Benna Dunks MD       Doyne Keel MD       Lindell Noe MD    Signature Line     Electronically Signed on 10/14/2020 07:42 PM EST   ________________________________________________   Mat Carne, MD, Valetta Mole               Modified by: Mat Carne, MD, Valetta Mole. on 10/14/2020 07:42 PM EST

## 2021-04-16 LAB — COMPREHENSIVE METABOLIC PANEL
ALT: 29 U/L (ref 0–50)
AST: 27 U/L (ref 0–50)
Albumin/Globulin Ratio: 1.6 (ref 1.00–2.70)
Albumin: 4.3 g/dL (ref 3.5–5.2)
Alk Phosphatase: 67 U/L (ref 40–130)
Anion Gap: 12 mmol/L (ref 2–17)
BUN: 22 mg/dL (ref 8–23)
Bun/Cre Ratio: 22 — ABNORMAL HIGH (ref 6.0–17.0)
CO2: 23 mmol/L (ref 22–29)
Calcium: 9.6 mg/dL (ref 8.8–10.2)
Chloride: 104 mmol/L (ref 98–107)
Creatinine: 1 mg/dL (ref 0.7–1.3)
Est, Glom Filt Rate: 81 mL/min/1.73m?? — ABNORMAL LOW (ref >=90)
Globulin: 2.7 g/dL (ref 1.9–4.4)
Glucose: 97 mg/dL (ref 70–99)
OSMOLALITY CALCULATED: 281 mOsm/kg (ref 270–287)
Potassium: 4.3 mmol/L (ref 3.5–5.3)
Sodium: 139 mmol/L (ref 135–145)
Total Bilirubin: 1.25 mg/dL — ABNORMAL HIGH (ref 0.00–1.20)
Total Protein: 7 g/dL (ref 6.4–8.3)

## 2021-04-16 LAB — HEMOGLOBIN A1C
Est. Avg. Glucose, WB: 114
Est. Avg. Glucose-calculated: 122
Hemoglobin A1C: 5.6 % (ref 4.0–6.0)

## 2021-05-06 NOTE — Telephone Encounter (Signed)
What test results do you need?  TTE  When did you have the test done? Wayne Stone  Where did you have the test done? 8/5  Are you on the patient portal?

## 2021-05-12 NOTE — Telephone Encounter (Signed)
What test results do you need? EKG  When did you have the test done? 04/30/2021  Where did you have the test done? [facility]  Are you on the patient portal? Yes- but would like a call back with results.

## 2021-05-14 NOTE — Telephone Encounter (Signed)
Please advise.

## 2021-05-14 NOTE — Telephone Encounter (Signed)
Duplicate. Other message sent to Dr Jennings Books to advise.

## 2021-05-14 NOTE — Telephone Encounter (Signed)
Pt aware.

## 2022-02-17 ENCOUNTER — Encounter

## 2022-02-18 ENCOUNTER — Encounter

## 2022-02-18 LAB — URINALYSIS W/ RFLX MICROSCOPIC
Bilirubin Urine: NEGATIVE
Blood, Urine: NEGATIVE
Glucose, UA: NEGATIVE
Ketones, Urine: NEGATIVE
Leukocyte Esterase, Urine: NEGATIVE
Nitrite, Urine: NEGATIVE
Protein, UA: NEGATIVE
Specific Gravity, UA: <=1.005 (ref 1.003–1.035)
Urobilinogen, Urine: 0.2 EU/dL
pH, UA: 5.5 (ref 4.5–8.0)

## 2022-02-18 LAB — HEMOGLOBIN A1C
Est. Avg. Glucose, WB: 114
Est. Avg. Glucose-calculated: 122
Hemoglobin A1C: 5.6 % (ref 4.0–6.0)

## 2022-02-18 LAB — COMPREHENSIVE METABOLIC PANEL
ALT: 16 U/L (ref 0–50)
AST: 19 U/L (ref 0–50)
Albumin/Globulin Ratio: 1.8 (ref 1.00–2.70)
Albumin: 4.3 g/dL (ref 3.5–5.2)
Alk Phosphatase: 79 U/L (ref 40–130)
Anion Gap: 9 mmol/L (ref 2–17)
BUN: 21 mg/dL (ref 8–23)
CO2: 23 mmol/L (ref 22–29)
Calcium: 9.4 mg/dL (ref 8.8–10.2)
Chloride: 106 mmol/L (ref 98–107)
Creatinine: 0.9 mg/dL (ref 0.7–1.3)
Est, Glom Filt Rate: 91 mL/min/1.73m (ref >=60)
Globulin: 2.4 g/dL (ref 1.9–4.4)
Glucose: 98 mg/dL (ref 70–99)
OSMOLALITY CALCULATED: 278 mOsm/kg (ref 270–287)
Potassium: 4.2 mmol/L (ref 3.5–5.3)
Sodium: 138 mmol/L (ref 135–145)
Total Bilirubin: 0.84 mg/dL (ref 0.00–1.20)
Total Protein: 6.7 g/dL (ref 6.4–8.3)

## 2022-02-18 LAB — LIPID PANEL
Chol/HDL Ratio: 2.6 (ref 0.0–4.4)
Cholesterol: 198 mg/dL (ref 100–200)
HDL: 77 mg/dL (ref >=40)
LDL Cholesterol: 110 mg/dL — ABNORMAL HIGH (ref 0.0–100.0)
LDL/HDL Ratio: 1.4
Triglycerides: 55 mg/dL (ref 0–149)
VLDL: 11 mg/dL (ref 5.0–40.0)

## 2022-02-18 LAB — CBC WITH AUTO DIFFERENTIAL
Absolute Baso #: 0 10*3/uL (ref 0.0–0.2)
Absolute Eos #: 0.1 10*3/uL (ref 0.0–0.5)
Absolute Lymph #: 1.7 10*3/uL (ref 1.0–3.2)
Absolute Mono #: 0.5 10*3/uL (ref 0.3–1.0)
Basophils %: 0.6 % (ref 0.0–2.0)
Eosinophils %: 2.5 % (ref 0.0–7.0)
Hematocrit: 35.8 % — ABNORMAL LOW (ref 38.0–52.0)
Hemoglobin: 11.7 g/dL — ABNORMAL LOW (ref 13.0–17.3)
Immature Grans (Abs): 0.01 10*3/uL (ref 0.00–0.06)
Immature Granulocytes: 0.2 % (ref 0.0–0.6)
Lymphocytes: 32.3 % (ref 15.0–45.0)
MCH: 27.4 pg (ref 27.0–34.5)
MCHC: 32.7 g/dL (ref 32.0–36.0)
MCV: 83.8 fL — ABNORMAL LOW (ref 84.0–100.0)
MPV: 9 fL (ref 7.2–13.2)
Monocytes: 9.9 % (ref 4.0–12.0)
NRBC Absolute: 0 10*3/uL (ref 0.000–0.012)
NRBC Automated: 0 % (ref 0.0–0.2)
Neutrophils %: 54.5 % (ref 42.0–74.0)
Neutrophils Absolute: 2.9 10*3/uL (ref 1.6–7.3)
Platelets: 417 10*3/uL (ref 140–440)
RBC: 4.27 x10e6/mcL (ref 4.00–5.60)
RDW: 14.6 % (ref 11.0–16.0)
WBC: 5.2 10*3/uL (ref 3.8–10.6)

## 2022-02-18 LAB — TSH WITH REFLEX: TSH: 1.06 mcIU/mL (ref 0.358–3.740)

## 2022-02-18 LAB — PSA SCREENING: Screening PSA: 0.127 ng/mL (ref 0.000–4.000)

## 2022-02-24 ENCOUNTER — Encounter

## 2022-02-24 ENCOUNTER — Ambulatory Visit: Admit: 2022-02-24 | Discharge: 2022-03-04 | Payer: MEDICARE | Attending: Family Medicine | Primary: Family Medicine

## 2022-02-24 DIAGNOSIS — E782 Mixed hyperlipidemia: Secondary | ICD-10-CM

## 2022-02-24 LAB — HEPATITIS C ANTIBODY: Hepatitis C Ab: NEGATIVE

## 2022-02-24 NOTE — Progress Notes (Addendum)
Positive Risk Factor Screenings with Interventions:               General Health and ACP:  General  In general, how would you say your health is?: Very Good  In the past 7 days, have you experienced any of the following: New or Increased Pain, New or Increased Fatigue, Loneliness, Social Isolation, Stress or Anger?: No  Do you get the social and emotional support that you need?: Yes  Do you have a Living Will?: Yes    Advance Directives       Power of Attorney Living Will ACP-Advance Directive ACP-Power of Attorney    Not on File Not on File Not on File Not on File              CHIEF COMPLAINT:  Chief Complaint   Patient presents with    Medicare AWV        HISTORY OF PRESENT ILLNESS:  [name] is a 71 y.o. male  who presents for SA WV and review of chronic issues.  Patient is status post prostate cancer treatment and is doing well.  He has some mild hypercholesterolemia but he declines any vascular screenings such as CT cardiac score or carotid ultrasound at this time.  His prediabetes has resolved.  He is due for colonoscopy.  He has had both pneumonia shots.    PHQ:  PHQ-9  02/22/2022   Little interest or pleasure in doing things 0   Feeling down, depressed, or hopeless 0   PHQ-2 Score 0   PHQ-9 Total Score 0       CURRENT MEDICATION LIST:    No current outpatient medications on file.     No current facility-administered medications for this visit.        ALLERGIES:    No Known Allergies     HISTORY:  Past Medical History:   Diagnosis Date    Umbilical hernia without obstruction or gangrene       Past Surgical History:   Procedure Laterality Date    OTHER SURGICAL HISTORY  12/2020    seed implation for prostate cancer    UMBILICAL HERNIA REPAIR  05/04/2016    robotic; dr. Alwyn Pea simpson      Social History     Socioeconomic History    Marital status: Married     Spouse name: Not on file    Number of children: Not on file    Years of education: Not on file    Highest education level: Not on file   Occupational  History    Not on file   Tobacco Use    Smoking status: Never    Smokeless tobacco: Never    Tobacco comments:     02/24/2022   Substance and Sexual Activity    Alcohol use: Never    Drug use: Never    Sexual activity: Not on file   Other Topics Concern    Not on file   Social History Narrative    Not on file     Social Determinants of Health     Financial Resource Strain: Not on file   Food Insecurity: Not on file   Transportation Needs: Not on file   Physical Activity: Sufficiently Active    Days of Exercise per Week: 5 days    Minutes of Exercise per Session: 60 min   Stress: Not on file   Social Connections: Not on file   Intimate Partner Violence: Not on file  Housing Stability: Not on file      Family History   Problem Relation Age of Onset    Other Mother         stents placed    High Cholesterol Mother     Coronary Art Dis Father         REVIEW OF SYSTEMS:  Per HPI. Otherwise balance of 10 point ROS is negative.     PHYSICAL EXAM:  Vital Signs -   Visit Vitals  BP 114/68 (Site: Left Upper Arm, Position: Sitting, Cuff Size: Large Adult)   Pulse 71   Temp 97.9 F (36.6 C) (Oral)   Ht 5\' 10"  (1.778 m)   Wt 209 lb (94.8 kg)   SpO2 98%   BMI 29.99 kg/m        GENERAL APPEARANCE: well developed, well nourished, alert and oriented X 3, no acute distress, appropriate affect  EYES: no lid lag, stare or proptosis, extraocular movement full and smooth, pupils equal, round, reactive to light  NECK: no jugular venous distention, no thyroid nodules, no thyromegaly, thyroid nontender  LYMPH NODES: no cervical or supraclavicular lymphadenopathy  HEART: normal rate and regular rhythm, no murmurs, rubs, gallops, or thrills  LUNGS: clear anteriorly and posteriorly, clear to auscultation bilaterally  ABDOMEN: bowel sounds present, soft, nontender, nondistended, no hepatosplenomegaly  SKIN: warm and dry, no rashes  EXTREMITIES: no edema, clubbing or cyanosis, good capillary refill in nail beds  PERIPHERAL PULSES: palpable in  all 4 extremities.   NEUROLOGIC: nonfocal  PSYCH: alert, oriented, cognitive function intact, judgment and insight good, mood/affect appropriate.           LABS  No results found for this visit on 02/24/22.  Orders Only on 02/18/2022   Component Date Value Ref Range Status    TSH 02/18/2022 1.060  0.358 - 3.740 mcIU/mL Final    Comment: TSH INTERPRETATION:    Controversy exists over an acceptable TSH range. Some experts argue that a  narrower reference range is better and will increase the detection of thyroid  disease, particularly marginal hypothyroidism.      WBC 02/18/2022 5.2  3.8 - 10.6 x10e3/mcL Final    RBC 02/18/2022 4.27  4.00 - 5.60 x10e6/mcL Final    Hemoglobin 02/18/2022 11.7 (L)  13.0 - 17.3 g/dL Final    Hematocrit 02/72/5366 35.8 (L)  38.0 - 52.0 % Final    MCV 02/18/2022 83.8 (L)  84.0 - 100.0 fL Final    MCH 02/18/2022 27.4  27.0 - 34.5 pg Final    MCHC 02/18/2022 32.7  32.0 - 36.0 g/dL Final    RDW 44/11/4740 14.6  11.0 - 16.0 % Final    Platelets 02/18/2022 417  140 - 440 x10e3/mcL Final    MPV 02/18/2022 9.0  7.2 - 13.2 fL Final    NRBC Automated 02/18/2022 0.0  0.0 - 0.2 % Final    NRBC Absolute 02/18/2022 0.000  0.000 - 0.012 x10e3/mcL Final    Neutrophils % 02/18/2022 54.5  42.0 - 74.0 % Final    Lymphocytes 02/18/2022 32.3  15.0 - 45.0 % Final    Monocytes 02/18/2022 9.9  4.0 - 12.0 % Final    Eosinophils % 02/18/2022 2.5  0.0 - 7.0 % Final    Basophils % 02/18/2022 0.6  0.0 - 2.0 % Final    Neutrophils Absolute 02/18/2022 2.9  1.6 - 7.3 x10e3/mcL Final    Absolute Lymph # 02/18/2022 1.7  1.0 - 3.2 x10e3/mcL Final  Absolute Mono # 02/18/2022 0.5  0.3 - 1.0 x10e3/mcL Final    Absolute Eos # 02/18/2022 0.1  0.0 - 0.5 x10e3/mcL Final    Absolute Baso # 02/18/2022 0.0  0.0 - 0.2 x10e3/mcL Final    Immature Granulocytes 02/18/2022 0.2  0.0 - 0.6 % Final    Immature Grans (Abs) 02/18/2022 0.01  0.00 - 0.06 x10e3/mcL Final    Sodium 02/18/2022 138  135 - 145 mmol/L Final    Potassium 02/18/2022  4.2  3.5 - 5.3 mmol/L Final    Chloride 02/18/2022 106  98 - 107 mmol/L Final    CO2 02/18/2022 23  22 - 29 mmol/L Final    Glucose 02/18/2022 98  70 - 99 mg/dL Final    BUN 13/04/6577 21  8 - 23 mg/dL Final    Creatinine 46/96/2952 0.9  0.7 - 1.3 mg/dL Final    Anion Gap 84/13/2440 9  2 - 17 mmol/L Final    OSMOLALITY CALCULATED 02/18/2022 278  270 - 287 mOsm/kg Final    Calcium 02/18/2022 9.4  8.8 - 10.2 mg/dL Final    Total Protein 02/18/2022 6.7  6.4 - 8.3 g/dL Final    Albumin 07/23/2535 4.3  3.5 - 5.2 g/dL Final    Globulin 64/40/3474 2.4  1.9 - 4.4 g/dL Final    Albumin/Globulin Ratio 02/18/2022 1.80  1.00 - 2.70 Final    Total Bilirubin 02/18/2022 0.84  0.00 - 1.20 mg/dL Final    Alk Phosphatase 02/18/2022 79  40 - 130 unit/L Final    AST 02/18/2022 19  0 - 50 unit/L Final    ALT 02/18/2022 16  0 - 50 unit/L Final    Est, Glom Filt Rate 02/18/2022 91  >=60 mL/min/1.16m Final    Comment: VERIFIED by Discern Expert.  GFR Interpretation:                                                                         % OF  KIDNEY  GFR                                                        STAGE  FUNCTION  ==================================================================================    > 90        Normal kidney function                       STAGE 1  90-100%  89 to 60      Mild loss of kidney function                 STAGE 2  80-60%  59 to 45      Mild to moderate loss of kidney function     STAGE 3a  59-45%  44 to 30      Moderate to severe loss of kidney function   STAGE 3b  44-30%  29 to 15      Severe loss of kidney function               STAGE  4  29-15%    < 15        Kidney failure                               STAGE 5  <15%  ==================================================================================  Modified from National Kidney Foundation    GFR Calculation performed using the CKD-EPI 2021 equation developed for use  with IDMS traceable creatinine methods and                            is the  calculation recommended by  the [facility] for estimating GFR in adults.      Cholesterol 02/18/2022 198  100 - 200 mg/dL Final    Comment: The National Cholesterol Education Program has published reference  cholesterol values for cardiovascular risk to be:    Less than 200 mg/dL     = Low Risk    161 to 239 mg/dL        = Borderline Risk    240mg /dL and greater    = High Risk      HDL 02/18/2022 77  >=40 mg/dL Final    Comment: The National Lipid Association and the [employer] Cholesterol Education Program  (NCEP) have set the guidelines for high-density lipoprotein (HDL) cholesterol  in adults ages 72 and up.      Triglycerides 02/18/2022 55  0 - 149 mg/dL Final    Comment:   TRIGLYCERIDE INTERPRETATION:                          Recommended Fasting Triglyc Levels for Adults                        =============================================                        Desirable                        < 150 mg/dL                        Average                          < 200 mg/dL                        Borderline High             200 to 500 mg/dL                        Hypertriglyceridemic             > 500 mg/dL                        =============================================      LDL Cholesterol 02/18/2022 110.0 (H)  0.0 - 100.0 mg/dL Final    LDL/HDL Ratio 02/18/2022 1.4   Final    Chol/HDL Ratio 02/18/2022 2.6  0.0 - 4.4 Final    VLDL 02/18/2022 11.0  5.0 - 40.0 mg/dL Final    Color, UA 09/60/4540 Yellow   Final    Appearance 02/18/2022 Clear   Final    Glucose,  UA 02/18/2022 Negative  Negative Final    Bilirubin Urine 02/18/2022 Negative  Negative Final    Ketones, Urine 02/18/2022 Negative  Negative Final    Specific Gravity, UA 02/18/2022 <=1.005  1.003 - 1.035 Final    Blood, Urine 02/18/2022 Negative  Negative Final    pH, UA 02/18/2022 5.5  4.5 - 8.0 Final    Protein, UA 02/18/2022 Negative  Negative Final    Urobilinogen, Urine 02/18/2022 0.2  1.0 EU/dL Final    Nitrite, Urine 02/18/2022  Negative  Negative Final    Leukocyte Esterase, Urine 02/18/2022 Negative  Negative Final    Screening PSA 02/18/2022 0.127  0.000 - 4.000 ng/mL Final    Comment: PSA INTERPRETATION:    PSA measured by Roche [employer] electrochemiluminescence immunoassay "ECLIA"  methodology.    At this time, no major scientific/medical organization including the American  Cancer Society and the American Urological Association have specific  recommendations in regard to prostate specific antigen (PSA) testing other  than recommending that men at age 85 and above and those who are at high risk  at age 71-45 and above be counseled in regard to the pros and cons of PSA  testing.    Guidelines for risk stratification    1.  Below 1:  Very low risk of prostate cancer  2.  1-4:       Approximately 15% risk of prostate cancer  3.  4-10:      25% risk of prostate cancer  4.  >10:       50% risk of prostate cancer    The use of PSA velocity (rate of rise of PSA over time) may also be useful in  predicting the risk of prostate cancer.  Most authorities recommend PSA  testing on at least three occasions over a period of 18 months to get an  accurate PSA velocity.    References                            available by request.      Hemoglobin A1C 02/18/2022 5.6  4.0 - 6.0 % Final    Comment: HEMOGLOBIN A1C INTERPRETATION:    The following arbitrary ranges may be used for interpretation of the results.  However, factors such as duration of diabetes, adherence to therapy, and  patient age should also be considered in assessing degree of blood glucose  control.    Hemoglobin A1C                 Avg. Blood Sugar  --------------------------------------------------------------  6%                           135 mg/dL  7%                           170 mg/dL  8%                           205 mg/dL  9%                           240 mg/dL  54%                          275 mg/dL    ======================================================  A1C                       Glucose Control  ----------------------------------------------------------------  < 6.0 %                   Normal  6.0 - 6.9 %               Abnormal  7.0 - 7.9 %               Sub-Optimal Control  > 8.0 %                   Inadequate Control      Est. Avg. Glucose, WB 02/18/2022 114   Final    Est. Avg. Glucose-calculated 02/18/2022 122   Final       IMPRESSION/PLAN    1. Mixed hyperlipidemia  2. 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 Colon cancer screening  -     Joslyn Hy, DO - Gastroenterology  5. Encounter for hepatitis C screening test for low risk patient  -     Hepatitis C Antibody; Future  6. Chest pain in adult     Chronic issues were reviewed and are low risk and stable.  Continue on current treatment.  Referral sent for colonoscopy.  He requested hepatitis C screening.    Patient did complain of some vague intermittent chest discomfort.  Sometimes it is with exertion.  We agreed to get a stress test for further evaluation.  He does have risk factors of hypercholesterolemia, prediabetes, and male over age 34.    Follow up and Dispositions:  Return in about 1 year (around 02/25/2023) for SAWV.       Oda Cogan, MD

## 2022-03-03 ENCOUNTER — Encounter

## 2022-03-03 NOTE — Telephone Encounter (Signed)
From: Hyman Hopes  To: Dr. Oda Cogan  Sent: 03/03/2022 12:20 PM EDT  Subject: Wayne Stone card    My brother Carrie's card in his wallet that registers an EKG to his phone. (He has a pacemaker). And other heart issues. I mentioned my occasional "chest discomfort" at my annual Medicare visit a couple weeks ago and just wondered if this kind of thing would be of value to me. You guys have run a few tests and no problems noted. it happens very randomly. You were skeptical it was heart. Maybe plaque buildup so the card may be of no help at all. Just curious. I don't want to buy if it won't be helpful. I did do the Hep C test after our visit. Negative. Thanks.   Wayne Stone.

## 2022-03-23 ENCOUNTER — Inpatient Hospital Stay: Admit: 2022-03-23 | Payer: MEDICARE | Attending: Family Medicine | Primary: Family Medicine

## 2022-03-23 DIAGNOSIS — R079 Chest pain, unspecified: Secondary | ICD-10-CM

## 2022-03-23 LAB — NM STRESS TEST WITH MYOCARDIAL PERFUSION
Angina Index: 0
Baseline Diastolic BP: 70 mmHg
Baseline HR: 64 {beats}/min
Baseline ST Depression: 0 mm
Baseline Systolic BP: 122 mmHg
Body Surface Area: 2.16 m2
Exercise Duration Time: 12
Exercuse Duration Seconds: 0
Nuc Stress EF: 66 %
Recovery Stage 1 Duration: 1 min:sec
Recovery Stage 1 HR: 108 {beats}/min
Recovery Stage 2 Duration: 2 min:sec
Recovery Stage 2 HR: 93 {beats}/min
Recovery Stage 3 Duration: 4 min:sec
Recovery Stage 3 HR: 86 {beats}/min
Stress Estimated Workload: 13.4 METS
Stress Peak HR: 139 {beats}/min
Stress Percent HR Achieved: 93 %
Stress Stage 1 Duration: 3 min:sec
Stress Stage 1 HR: 96 {beats}/min
Stress Stage 2 Duration: 3 min:sec
Stress Stage 2 HR: 109 {beats}/min
Stress Stage 3 Duration: 3 min:sec
Stress Stage 3 HR: 120 {beats}/min
Stress Stage 4 Duration: 3 min:sec
Stress Stage 4 HR: 141 {beats}/min
Stress Target HR: 149 {beats}/min
TID: 1.16

## 2022-03-23 MED ORDER — TECHNETIUM TC 99M SESTAMIBI - MIRALUMA
Freq: Once | Status: AC | PRN
Start: 2022-03-23 — End: 2022-03-23
  Administered 2022-03-23: 12:00:00 10 via INTRAVENOUS

## 2022-03-23 MED ORDER — TECHNETIUM TC 99M SESTAMIBI - MIRALUMA
Freq: Once | Status: AC | PRN
Start: 2022-03-23 — End: 2022-03-23
  Administered 2022-03-23: 13:00:00 30 via INTRAVENOUS

## 2022-04-20 ENCOUNTER — Inpatient Hospital Stay
Admit: 2022-04-20 | Discharge: 2022-04-20 | Disposition: A | Discharge status: Home / Self Care | Payer: MEDICARE | Attending: Emergency Medicine

## 2022-04-20 ENCOUNTER — Emergency Department: Admit: 2022-04-20 | Discharge: 2022-05-31 | Payer: MEDICARE | Primary: Family Medicine

## 2022-04-20 DIAGNOSIS — I471 Supraventricular tachycardia: Secondary | ICD-10-CM

## 2022-04-20 LAB — BASIC METABOLIC PANEL
Anion Gap: 12 mmol/L (ref 2–17)
BUN: 20 mg/dL (ref 8–23)
CO2: 22 mmol/L (ref 22–29)
Calcium: 9.1 mg/dL (ref 8.8–10.2)
Chloride: 106 mmol/L (ref 98–107)
Creatinine: 1 mg/dL (ref 0.7–1.3)
Est, Glom Filt Rate: 80 mL/min/1.73m?? (ref >=60)
Glucose: 108 mg/dL — ABNORMAL HIGH (ref 70–99)
Osmolaliy Calculated: 282 mosm/kg (ref 270–287)
Potassium: 4.3 mmol/L (ref 3.5–5.3)
Sodium: 140 mmol/L (ref 135–145)

## 2022-04-20 LAB — CBC WITH AUTO DIFFERENTIAL
Basophils %: 0.3 % (ref 0.0–2.0)
Basophils Absolute: 0 10*3/uL (ref 0.0–0.2)
Eosinophils %: 2.3 % (ref 0.0–7.0)
Eosinophils Absolute: 0.3 10*3/uL (ref 0.0–0.5)
Hematocrit: 39.4 % (ref 38.0–52.0)
Hemoglobin: 12.9 g/dL — ABNORMAL LOW (ref 13.0–17.3)
Immature Grans (Abs): 0.02 10*3/uL (ref 0.00–0.06)
Immature Granulocytes %: 0.2 % (ref 0.0–0.6)
Lymphocytes Absolute: 3.2 10*3/uL (ref 1.0–3.2)
Lymphocytes: 28.1 % (ref 15.0–45.0)
MCH: 27.1 pg (ref 27.0–34.5)
MCHC: 32.7 g/dL (ref 32.0–36.0)
MCV: 82.8 fL — ABNORMAL LOW (ref 84.0–100.0)
MPV: 8.5 fL (ref 7.2–13.2)
Monocytes %: 9.2 % (ref 4.0–12.0)
Monocytes Absolute: 1.1 10*3/uL — ABNORMAL HIGH (ref 0.3–1.0)
Neutrophils %: 59.9 % (ref 42.0–74.0)
Neutrophils Absolute: 6.9 10*3/uL (ref 1.6–7.3)
Platelets: 369 10*3/uL (ref 140–440)
RBC: 4.76 x10e6/mcL (ref 4.00–5.60)
RDW: 16.4 % — ABNORMAL HIGH (ref 11.0–16.0)
WBC: 11.6 10*3/uL — ABNORMAL HIGH (ref 3.8–10.6)

## 2022-04-20 LAB — EKG 12-LEAD
P Axis: 56 degrees
P-R Interval: 0 ms
P-R Interval: 142 ms
Q-T Interval: 250 ms
Q-T Interval: 290 ms
QRS Duration: 90 ms
QRS Duration: 90 ms
QTc Calculation (Bazett): 344 ms
QTc Calculation (Bazett): 345 ms
R Axis: 1 degrees
R Axis: 15 degrees
T Axis: 67 degrees
T Axis: 68 degrees
Ventricular Rate: 181 {beats}/min
Ventricular Rate: 96 {beats}/min

## 2022-04-20 LAB — TSH WITH REFLEX: TSH: 1.56 mcIU/mL (ref 0.358–3.740)

## 2022-04-20 LAB — MAGNESIUM: Magnesium: 2.2 mg/dL (ref 1.6–2.6)

## 2022-04-20 LAB — CK: Total CK: 120 U/L (ref 20–200)

## 2022-04-20 MED ORDER — ADENOSINE 6 MG/2ML IV SOLN
6 MG/2ML | INTRAVENOUS | Status: AC
Start: 2022-04-20 — End: 2022-04-20
  Administered 2022-04-20: 18:00:00 6 via INTRAVENOUS

## 2022-04-20 MED ORDER — ADENOSINE 6 MG/2ML IV SOLN
6 MG/2ML | Freq: Once | INTRAVENOUS | Status: AC
Start: 2022-04-20 — End: 2022-04-20

## 2022-04-20 MED FILL — ADENOSINE 6 MG/2ML IV SOLN: 6 MG/2ML | INTRAVENOUS | Qty: 6

## 2022-04-20 NOTE — ED Provider Notes (Signed)
[facility] DEPT  EMERGENCY DEPARTMENT ENCOUNTER      Pt Name: Wayne Stone  MRN: [id number]  Birthdate [birth year]  Date of evaluation: 04/20/2022  Provider: Veverly Fells, MD  Provider evaluation time: 04/20/22 1404    CHIEF COMPLAINT       Chief Complaint   Patient presents with    Palpitations     Heart racing x 1 hour         HISTORY OF PRESENT ILLNESS   (Location/Symptom, Timing/Onset, Context/Setting, Quality, Duration, Modifying Factors, Severity)  Note limiting factors.     HPI    Wayne Stone is a 71 y.o. male who presents with complaint of heart racing for the last hour.  He states he was just sitting when symptoms started.  He states his Apple Watch flagged him and he also feels his heart pounding and racing.  He reports having a recent stress test that was negative.  He denies chest pain, shortness of breath, diaphoresis, dizziness, or recent illnesses.    Wife mentions the patient has had 2 near syncopal episodes in the last year.  For this reason he did follow-up with his PCP and had a stress test last month which was normal.      Nursing Notes were reviewed.    REVIEW OF SYSTEMS    (2-9 systems for level 4, 10 or more for level 5)     Review of Systems   Constitutional:  Negative for fever.   HENT:  Negative for trouble swallowing.    Eyes:  Negative for visual disturbance.   Respiratory:  Negative for shortness of breath.    Cardiovascular:  Positive for palpitations. Negative for chest pain.   Gastrointestinal:  Negative for diarrhea and vomiting.   Genitourinary:  Negative for dysuria.   Musculoskeletal:  Negative for myalgias.   Skin:  Negative for rash.   Neurological:  Negative for dizziness and weakness.     Except as noted above the remainder of the review of systems was reviewed and is negative.     PAST MEDICAL HISTORY     Past Medical History:   Diagnosis Date    Umbilical hernia without obstruction or gangrene        SURGICAL HISTORY       Past Surgical History:   Procedure  Laterality Date    OTHER SURGICAL HISTORY  12/2020    seed implation for prostate cancer    UMBILICAL HERNIA REPAIR  05/04/2016    robotic; dr. Alwyn Pea simpson       CURRENT MEDICATIONS       There are no discharge medications for this patient.      ALLERGIES     Patient has no known allergies.    FAMILY HISTORY       Family History   Problem Relation Age of Onset    Other Mother         stents placed    High Cholesterol Mother     Coronary Art Dis Father         SOCIAL HISTORY       Social History     Socioeconomic History    Marital status: Married   Tobacco Use    Smoking status: Never    Smokeless tobacco: Never    Tobacco comments:     02/24/2022   Substance and Sexual Activity    Alcohol use: Never    Drug use: Never     Social Determinants  of Health     Physical Activity: Sufficiently Active    Days of Exercise per Week: 5 days    Minutes of Exercise per Session: 60 min       SCREENINGS       Glasgow Coma Scale  Eye Opening: Spontaneous  Best Verbal Response: Oriented  Best Motor Response: Obeys commands  Glasgow Coma Scale Score: 15             CIWA Assessment  BP: 119/77  Pulse: 80             PHYSICAL EXAM    (up to 7 for level 4, 8 or more for level 5)     ED Triage Vitals [04/20/22 1401]   BP Temp Temp src Pulse Respirations SpO2 Height Weight - Scale   106/76 97.7 F (36.5 C) -- (!) 190 16 97 % -- 190 lb (86.2 kg)       Physical Exam  Vitals and nursing note reviewed.   Constitutional:       Comments: No distress noted, happily chatting during exam   HENT:      Head: Normocephalic and atraumatic.   Eyes:      Extraocular Movements: Extraocular movements intact.      Pupils: Pupils are equal, round, and reactive to light.   Neck:      Trachea: Trachea normal.   Cardiovascular:      Rate and Rhythm: Regular rhythm. Tachycardia present.      Pulses: Normal pulses.   Pulmonary:      Effort: Pulmonary effort is normal.      Breath sounds: Normal breath sounds.   Abdominal:      Palpations: Abdomen is soft.       Tenderness: There is no abdominal tenderness.   Musculoskeletal:      Cervical back: Neck supple.   Skin:     General: Skin is warm and dry.   Neurological:      General: No focal deficit present.      Mental Status: He is alert and oriented to person, place, and time.   Psychiatric:         Behavior: Behavior normal. Behavior is cooperative.       DIAGNOSTIC RESULTS     EKG: All EKG's are interpreted by the Emergency Department Physician who either signs or Co-signs this chart in the absence of a cardiologist.    See ED course.    RADIOLOGY:   Non-plain film images such as CT, Ultrasound and MRI are read by the radiologist. Plain radiographic images are visualized and preliminarily interpreted by the emergency physician with the below findings:    See ED course.    Interpretation per the Radiologist below, if available at the time of this note:    No orders to display       LABS:  Labs Reviewed   BASIC METABOLIC PANEL - Abnormal; Notable for the following components:       Result Value    Glucose 108 (*)     All other components within normal limits   CBC WITH AUTO DIFFERENTIAL - Abnormal; Notable for the following components:    WBC 11.6 (*)     Hemoglobin 12.9 (*)     MCV 82.8 (*)     RDW 16.4 (*)     Absolute Mono # 1.1 (*)     All other components within normal limits   CK   MAGNESIUM   TSH WITH  REFLEX       All other labs were within normal range or not returned as of this dictation.    EMERGENCY DEPARTMENT COURSE / MDM:     VITALS:    Vitals:    04/20/22 1401 04/20/22 1415   BP: 106/76 119/77   Pulse: (!) 190 80   Resp: 16 24   Temp: 97.7 F (36.5 C)    SpO2: 97% 96%   Weight: 86.2 kg        ED MEDICATIONS:  Medications   adenosine (ADENOCARD) injection 6 mg (6 mg IntraVENous Given 04/20/22 1424)       PROCEDURES:  Procedures    CONSULTS:  None    ED Course as of 04/20/22 1602   Wed Apr 20, 2022   1414 12-lead EKG reviewed and interpreted by myself.   @1356 : 188 bpm, SVT [CD]   1531 12-lead EKG reviewed and  interpreted by myself.   @1408 : (after adenosine) 96 bpm, NSR with normal intervals.  No acute ST/T-wave abnormalities noted. [CD]      ED Course User Index  [CD] , MD       Medical Decision Making  Problems Addressed:  Paroxysmal supraventricular tachycardia [facility]): acute illness or injury that poses a threat to life or bodily functions     Details: SVT resolved to NSR with 1 dose of adenosine.  No chest pain, shortness of breath, nausea, diaphoresis, or dizziness.  Placed Holter due to reported history of 2 near-syncopal episodes with 1 only a week ago.    Amount and/or Complexity of Data Reviewed  Independent Historian: spouse  External Data Reviewed: radiology.     Details: Recent stress test with Dr. Veverly Fells 03/23/22  Labs: ordered.  ECG/medicine tests: ordered and independent interpretation performed. Decision-making details documented in ED Course.    Risk  Prescription drug management.  Drug therapy requiring intensive monitoring for toxicity.        FINAL IMPRESSION      1. Paroxysmal supraventricular tachycardia [facility])          DISPOSITION/PLAN   DISPOSITION Decision To Discharge 04/20/2022 03:23:12 PM      I had a detailed discussion with the patient regarding their ED findings and the discharge diagnosis and plan of care.  Counseled the patient regarding the need for outpatient followup.  Provided strict return precautions regarding the need to return to the ED immediately for any new or worsening symptoms or for any other concerns.  Patient voiced understanding and agreement with the plan of care.      PATIENT REFERRED TO:  [facility], MD  [address]  [place] [address] [place]  [phone]    Schedule an appointment as soon as possible for a visit         DISCHARGE MEDICATIONS:  There are no discharge medications for this patient.    Controlled Substances Monitoring:     No flowsheet data found.    (Please note that portions of this note were completed with a voice  recognition program.  Efforts were made to edit the dictations but occasionally words are mis-transcribed.)    [id number], MD (electronically signed)  Attending Emergency Physician        [phone], MD  04/20/22 [phone]

## 2022-05-03 ENCOUNTER — Inpatient Hospital Stay: Admit: 2022-05-03 | Discharge: 2022-05-03 | Disposition: A | Discharge status: Home / Self Care | Payer: MEDICARE

## 2022-05-03 ENCOUNTER — Emergency Department: Admit: 2022-05-03 | Payer: MEDICARE | Primary: Family Medicine

## 2022-05-03 DIAGNOSIS — R002 Palpitations: Secondary | ICD-10-CM

## 2022-05-03 LAB — CBC WITH AUTO DIFFERENTIAL
Absolute Baso #: 0 10*3/uL (ref 0.0–0.2)
Absolute Eos #: 0.3 10*3/uL (ref 0.0–0.5)
Absolute Lymph #: 2.9 10*3/uL (ref 1.0–3.2)
Absolute Mono #: 0.9 10*3/uL (ref 0.3–1.0)
Basophils %: 0.4 % (ref 0.0–2.0)
Eosinophils %: 2.5 % (ref 0.0–7.0)
Hematocrit: 40.9 % (ref 38.0–52.0)
Hemoglobin: 13.5 g/dL (ref 13.0–17.3)
Immature Grans (Abs): 0.02 10*3/uL (ref 0.00–0.06)
Immature Granulocytes: 0.2 % (ref 0.0–0.6)
Lymphocytes: 28.5 % (ref 15.0–45.0)
MCH: 27.2 pg (ref 27.0–34.5)
MCHC: 33 g/dL (ref 32.0–36.0)
MCV: 82.3 fL — ABNORMAL LOW (ref 84.0–100.0)
MPV: 8.6 fL (ref 7.2–13.2)
Monocytes: 9.2 % (ref 4.0–12.0)
Neutrophils %: 59.2 % (ref 42.0–74.0)
Neutrophils Absolute: 6 10*3/uL (ref 1.6–7.3)
Platelets: 411 10*3/uL (ref 140–440)
RBC: 4.97 x10e6/mcL (ref 4.00–5.60)
RDW: 16.7 % — ABNORMAL HIGH (ref 11.0–16.0)
WBC: 10.1 10*3/uL (ref 3.8–10.6)

## 2022-05-03 LAB — COMPREHENSIVE METABOLIC PANEL
ALT: 35 U/L (ref 0–50)
AST: 38 U/L (ref 0–50)
Albumin/Globulin Ratio: 1.68 (ref 1.00–2.70)
Albumin: 4.3 g/dL (ref 3.5–5.2)
Alk Phosphatase: 127 U/L (ref 40–130)
Anion Gap: 13 mmol/L (ref 2–17)
BUN: 20 mg/dL (ref 8–23)
CO2: 22 mmol/L (ref 22–29)
Calcium: 9.5 mg/dL (ref 8.8–10.2)
Chloride: 104 mmol/L (ref 98–107)
Creatinine: 0.9 mg/dL (ref 0.7–1.3)
Est, Glom Filt Rate: 91 mL/min/1.73m (ref >=60)
Globulin: 2.6 g/dL (ref 1.9–4.4)
Glucose: 128 mg/dL — ABNORMAL HIGH (ref 70–99)
OSMOLALITY CALCULATED: 281 mOsm/kg (ref 270–287)
Potassium: 4.2 mmol/L (ref 3.5–5.3)
Sodium: 139 mmol/L (ref 135–145)
Total Bilirubin: 0.62 mg/dL (ref 0.00–1.20)
Total Protein: 6.9 g/dL (ref 6.4–8.3)

## 2022-05-03 LAB — MAGNESIUM: Magnesium: 2.2 mg/dL (ref 1.6–2.6)

## 2022-05-03 LAB — TROPONIN: Troponin T: <0.01 ng/mL (ref 0.000–0.010)

## 2022-05-03 MED ORDER — DILTIAZEM HCL ER COATED BEADS 120 MG PO CP24
120 MG | ORAL_CAPSULE | Freq: Every day | ORAL | 0 refills | Status: DC
Start: 2022-05-03 — End: 2022-05-31

## 2022-05-03 NOTE — ED Provider Notes (Signed)
[facility]  EMERGENCY DEPARTMENT ENCOUNTER      Pt Name: Wayne Stone  MRN: [id number]  Birthdate 1951-02-11  Date of evaluation: 05/03/2022  Provider: Janett Billow, PA-C    CHIEF COMPLAINT       Chief Complaint   Patient presents with    Tachycardia     Pt came to ED with c/o heartrate at 140 at noon. Pt was 188 bpm in triage initially. Pt converted in route to ED room. Pt heartrate is 89 bpm in room         HISTORY OF PRESENT ILLNESS    The history is provided by the patient.     71 y.o. male presents to the ER with palpitations and heart racing since noon today, approximately 5 hours prior to arrival.  Patient states that he had an episode of heart racing palpitations yesterday which lasted for approximately 15 minutes before subsiding.  Today it was sustained for approximately 5 hours with his Apple Watch reading a rate up to 140 bpm.  He states that he was experiencing palpitations as well as some lightheadedness with this episode today.  No further complaints.  During triage today he had a HR of 188.  When he was walking back to the ED to be put in the room, he had converted to sinus rhythm. Patient had an episode of SVT 2 weeks ago which she was seen in this ED for and was given 1 dose of adenosine which converted him.  He was placed on a Holter monitor for 2 near syncopal episodes in the past year and he states that he has mailed this in for interpretation already.  Currently he is asymptomatic and feels well.  Denies fever, chills, lightheadedness, syncope, chest pain, shortness of breath, cough, hemoptysis, nausea, vomiting, abdominal pain, back pain.    Nursing Notes were reviewed.    REVIEW OF SYSTEMS     Review of Systems   All other systems reviewed and are negative.    Except as noted above the remainder of the review of systems was reviewed and negative.       PAST MEDICAL HISTORY     Past Medical History:   Diagnosis Date    Umbilical hernia without obstruction or gangrene         SURGICAL HISTORY       Past Surgical History:   Procedure Laterality Date    OTHER SURGICAL HISTORY  12/2020    seed implation for prostate cancer    UMBILICAL HERNIA REPAIR  05/04/2016    robotic; dr. Alwyn Pea simpson       CURRENT MEDICATIONS       Discharge Medication List as of 05/03/2022  6:49 PM          ALLERGIES     Patient has no known allergies.    FAMILY HISTORY       Family History   Problem Relation Age of Onset    Other Mother         stents placed    High Cholesterol Mother     Coronary Art Dis Father         SOCIAL HISTORY       Social History     Socioeconomic History    Marital status: Married   Tobacco Use    Smoking status: Never    Smokeless tobacco: Never    Tobacco comments:     02/24/2022   Substance and Sexual Activity  Alcohol use: Never    Drug use: Never     Social Determinants of Health     Physical Activity: Sufficiently Active    Days of Exercise per Week: 5 days    Minutes of Exercise per Session: 60 min       SCREENINGS         Glasgow Coma Scale  Eye Opening: Spontaneous  Best Verbal Response: Oriented  Best Motor Response: Obeys commands  Glasgow Coma Scale Score: 15                     CIWA Assessment  BP: 120/85  Pulse: 71                 PHYSICAL EXAM       ED Triage Vitals [05/03/22 1717]   BP Temp Temp src Pulse Respirations SpO2 Height Weight - Scale   -- 98.2 F (36.8 C) -- 89 13 96 % 5\' 10"  (1.778 m) 205 lb (93 kg)       Physical Exam  Vitals and nursing note reviewed.   Constitutional:       General: He is not in acute distress.     Appearance: Normal appearance. He is not ill-appearing.   HENT:      Head: Normocephalic and atraumatic.      Mouth/Throat:      Mouth: Mucous membranes are moist.   Eyes:      Pupils: Pupils are equal, round, and reactive to light.   Cardiovascular:      Rate and Rhythm: Normal rate and regular rhythm.      Pulses: Normal pulses.   Pulmonary:      Effort: Pulmonary effort is normal. No respiratory distress.      Breath sounds: Normal  breath sounds.   Abdominal:      General: Abdomen is flat. Bowel sounds are normal.      Palpations: Abdomen is soft.      Tenderness: There is no abdominal tenderness. There is no right CVA tenderness or left CVA tenderness.   Musculoskeletal:         General: No deformity. Normal range of motion.      Cervical back: Normal range of motion and neck supple.   Skin:     General: Skin is warm and dry.      Capillary Refill: Capillary refill takes less than 2 seconds.   Neurological:      General: No focal deficit present.      Mental Status: He is alert and oriented to person, place, and time.   Psychiatric:         Mood and Affect: Mood normal.         Behavior: Behavior normal.       DIAGNOSTIC RESULTS     EKG: All EKG's are interpreted by the Emergency Department Physician who either signs or Co-signs this chart in the absence of a cardiologist.    RADIOLOGY:   Non-plain film images such as CT, Ultrasound and MRI are read by the radiologist. Plain radiographic images are visualized and preliminarily interpreted by the emergency physician with the below findings:    Interpretation per the Radiologist below, if available at the time of this note:    XR CHEST PORTABLE   Final Result   Impression: No acute lung disease.             LABS:  Labs Reviewed   CBC WITH AUTO DIFFERENTIAL -  Abnormal; Notable for the following components:       Result Value    MCV 82.3 (*)     RDW 16.7 (*)     All other components within normal limits   COMPREHENSIVE METABOLIC PANEL - Abnormal; Notable for the following components:    Glucose 128 (*)     All other components within normal limits   MAGNESIUM   TROPONIN       All other labs were within normal range or not returned as of this dictation.    PROCEDURE:  Procedures    EMERGENCY DEPARTMENT COURSE/REASSESSMENT and MDM:   MDM  Number of Diagnoses or Management Options  History of paroxysmal supraventricular tachycardia  Palpitations  Diagnosis management comments: Patient is a  well-appearing 71 year old male who presents to the emergency department afebrile with normal vital signs.  He presents today for palpitations for approximately 5 hours with a elevated heart rate on his Apple Watch.  He was 188 bpm in triage however he converted to normal sinus rhythm when walking back to the ED.  He has been in normal sinus rhythm for the duration of his ED stay on the cardiac monitor.  He is asymptomatic at this time.  Lab work is unremarkable.  He had an episode of SVT 2 weeks ago with a Holter monitor x1 week.  He states that he was feeling perfectly fine until a 15-minute episode of palpitations yesterday and in the 5-hour episode today.  He does not take any medications. I consulted Dr. Dorcas Mcmurray who the patient has a scheduled appointment within 1 month, he recommends 120 mg of diltiazem extended release and for the patient to reach out if an event happens again while on this medication for a sooner appointment.  I gave the patient strict return precautions for new or worsening symptoms.  He is agreeable to this plan and he is well-appearing with discharge.       Amount and/or Complexity of Data Reviewed  Clinical lab tests: reviewed  Tests in the radiology section of CPT: reviewed  Tests in the medicine section of CPT: reviewed  Review and summarize past medical records: yes        CONSULTS:  None    ED Course as of 05/03/22 1907   Tue May 03, 2022   1757 The ECG interpreted by me, in the absence of a cardiologist, shows:  normal sinus rhythm   Rate of 84  Axis is Normal  QTc is normal  Intervals and Durations are unremarkable.      Nonspecific ST-T wave changes appreciated.  No evidence of acute ischemia.  [SW]      ED Course User Index  [SW] Janett Billow, PA-C        FINAL IMPRESSION      1. Palpitations    2. History of paroxysmal supraventricular tachycardia          DISPOSITION/PLAN   DISPOSITION Decision To Discharge 05/03/2022 06:48:30 PM      PATIENT REFERRED TO:  Oda Cogan, MD  [address]  [address]  [place] [address]  [phone]    In 1 week      Adah Perl, MD  [address]  [place] [address]  [phone]    In 1 month        DISCHARGE MEDICATIONS:  Discharge Medication List as of 05/03/2022  6:49 PM        START  taking these medications    Details   dilTIAZem (CARDIZEM CD) 120 MG extended release capsule Take 1 capsule by mouth daily, Disp-30 capsule, R-0Print           Controlled Substances Monitoring:     No flowsheet data found.    (Please note that portions of this note were completed with a voice recognition program.  Efforts were made to edit the dictations but occasionally words are mis-transcribed.)    Janett Billow, PA-C (electronically signed)  Emergency Physician Assistant           Janett Billow, PA-C  05/03/22 Windell Moment

## 2022-05-03 NOTE — ED Notes (Signed)
Contacted pt concerning er visit yesterday; states feeling better with ordered med; will follow up with PCP soon and Cards in a Month; will return to er for worsening symptoms or concerns     Jenell Milliner, RN  05/04/22 1655

## 2022-05-04 LAB — EKG 12-LEAD
P Axis: 59 degrees
P-R Interval: 144 ms
Q-T Interval: 328 ms
QRS Duration: 92 ms
QTc Calculation (Bazett): 370 ms
R Axis: -5 degrees
T Axis: 18 degrees
Ventricular Rate: 84 {beats}/min

## 2022-05-31 ENCOUNTER — Encounter

## 2022-05-31 ENCOUNTER — Ambulatory Visit
Admit: 2022-05-31 | Discharge: 2022-05-31 | Payer: PRIVATE HEALTH INSURANCE | Attending: Internal Medicine | Primary: Family Medicine

## 2022-05-31 DIAGNOSIS — I471 Supraventricular tachycardia: Secondary | ICD-10-CM

## 2022-05-31 MED ORDER — DILTIAZEM HCL ER COATED BEADS 120 MG PO CP24
120 MG | ORAL_CAPSULE | Freq: Every day | ORAL | 3 refills | Status: AC
Start: 2022-05-31 — End: 2022-07-14

## 2022-05-31 NOTE — Progress Notes (Signed)
Review of Systems   Constitutional:  Negative for activity change, fatigue, fever and unexpected weight change.   HENT:  Negative for nosebleeds.    Eyes:  Negative for visual disturbance.   Respiratory:  Negative for cough, chest tightness, shortness of breath and wheezing.    Cardiovascular:  Positive for palpitations. Negative for chest pain and leg swelling.   Gastrointestinal:  Negative for abdominal pain, constipation, diarrhea and nausea.   Endocrine: Negative for polydipsia and polyuria.   Genitourinary:  Negative for flank pain.   Musculoskeletal:  Negative for joint swelling and myalgias.   Skin:  Negative for rash.   Neurological:  Positive for light-headedness. Negative for dizziness, syncope, weakness, numbness and headaches.   Psychiatric/Behavioral:  The patient is not nervous/anxious.

## 2022-05-31 NOTE — Progress Notes (Signed)
[facility]    Date:  May 31, 2022  Patient name: Wayne Stone  Date of Birth: 09-24-1951      HISTORY OF PRESENT ILLNESS          Chief Complaint:    Chief Complaint   Patient presents with    New Patient     Fu after ED visit       Wayne Stone is a 71 y.o. male, retired Retail banker, with history significant for hyperlipidemia, and prostate cancer (completed treatment in 2022), who presents for evaluation of palpitations and supraventricular tachycardia.    Presented to the ER at [facility] on 04/20/2022, complaining of approximately 1 hour of heart racing sensation; he also noted that his heart rate was elevated on his Apple Watch, and he received an alert for resting tachycardia.  At the time, his wife also noted that he had 2 near syncopal episodes in the last year.  In the ER, his presenting heart rate was in the 190s, and he was administered adenosine, which converted him to sinus rhythm.  Remainder of his work-up was otherwise unremarkable.  Following adenosine, he was in sinus rhythm with heart rate in the 90s.  He was started on low-dose diltiazem, and was recommended for outpatient follow-up.    On presentation today, Wayne Stone notes intermittent episodes fluttering sensation in the chest for several years.  However, his episodes are usually self-limited.  He presented to the ER on 04/20/2022, after he experienced sustained tachycardia.  He was converted in the ER, and did well until 05/03/2022, when he experienced 5 hrs of tachycardia; he presented to the ER, and as he was climbing up on the gurney.    As far as the episodes of near/syncope, one episodes was 20 months ago, and the most recent episode was a month ago.  He was standing, talking to his wife in the yard, when he experienced momentary LOC; he went down,  and fell into his bird bath in the yard.      He walks for exercise, and has not experienced any lightheadedness or palpitations with exertion.      Wayne Stone donates blood regularly, and has been told that his HR is on the lower end.      Wayne Stone's brother and 2 nieces have atrial fibrillation.  His brother also has CAD, status post CABG.  Wayne Stone's father also had CAD.      PAST MEDICAL, SOCIAL AND FAMILY HISTORY          Past Medical History:   has a past medical history of Umbilical hernia without obstruction or gangrene.    Past Surgical History:   has a past surgical history that includes Umbilical hernia repair (05/04/2016) and other surgical history (12/2020).     Social History:   reports that he has never smoked. He has never used smokeless tobacco. He reports that he does not drink alcohol and does not use drugs.     Family History: family history includes Coronary Art Dis in his father; High Cholesterol in his mother; Other in his mother.      MEDICATIONS AND ALLERGIES           Prior to Admission medications    Medication Sig Start Date End Date Taking? Authorizing Provider   dilTIAZem (CARDIZEM CD) 120 MG extended release capsule Take 1 capsule by mouth daily 05/03/22   Janett Billow, PA-C       Allergies:  Patient  has no known allergies.        REVIEW OF SYSTEMS         Review of Systems   Constitutional:  Negative for activity change, fatigue, fever and unexpected weight change.   HENT:  Negative for nosebleeds.    Eyes:  Negative for visual disturbance.   Respiratory:  Negative for cough, chest tightness, shortness of breath and wheezing.    Cardiovascular:  Positive for palpitations. Negative for chest pain and leg swelling.   Gastrointestinal:  Negative for abdominal pain, constipation, diarrhea and nausea.   Endocrine: Negative for polydipsia and polyuria.   Genitourinary:  Negative for flank pain.   Musculoskeletal:  Negative for joint swelling and myalgias.   Skin:  Negative for rash.    Neurological:  Positive for light-headedness. Negative for dizziness, syncope, weakness, numbness and headaches.   Psychiatric/Behavioral:  The patient is not nervous/anxious.         PHYSICAL EXAM          BP 112/82 (Site: Left Upper Arm, Position: Sitting, Cuff Size: Medium Adult)   Pulse 67   Ht 5\' 10"  (1.778 m)   Wt 210 lb 12.8 oz (95.6 kg)   SpO2 96%   BMI 30.25 kg/m      Physical Exam      CARDIAC DIAGNOSTICS         Lab Results   Component Value Date    CHOL 198 02/18/2022    CHOL 217 (H) 08/26/2020    CHOL 189 01/28/2020     Lab Results   Component Value Date    TRIG 55 02/18/2022    TRIG 84 08/26/2020    TRIG 77 01/28/2020     Lab Results   Component Value Date    HDL 77 02/18/2022    HDL 73 08/26/2020    HDL 69 01/28/2020     Lab Results   Component Value Date    LDLCHOLESTEROL 110.0 (H) 02/18/2022    LDLCHOLESTEROL 127.2 (H) 08/26/2020    LDLCHOLESTEROL 105.0 (H) 01/28/2020     Lab Results   Component Value Date    VLDL 11.0 02/18/2022    VLDL 16.8 08/26/2020    VLDL 15.4 01/28/2020     Lab Results   Component Value Date    CHOLHDLRATIO 2.6 02/18/2022    CHOLHDLRATIO 3.0 08/26/2020    CHOLHDLRATIO 2.7 01/28/2020          Encounter Date: 05/03/22   EKG 12 Lead   Result Value    Ventricular Rate 84    P-R Interval 144    QRS Duration 92    Q-T Interval 328    QTc Calculation (Bazett) 370    P Axis 59    R Axis -5    T Axis 18    Diagnosis      SINUS RHYTHM  NORMAL ECG    Confirmed by 07/03/22 ([id number]) on 05/04/2022 8:40:42 AM         CARDIAC MONITOR (04/21/2022):    UNREMARKABLE CARDIAC MONITOR    ONE SHORT RUN OF NON-SUSTAINED SUPRAVENTRICULAR TACHYCARDIA    NO SYMPTOM CORRELATION WITH ARRHYTHMIA    04/30/21    TRANSTHORACIC ECHOCARDIOGRAM (TTE) COMPLETE 04/30/2021  7:01 PM (Final)  Signed by: 06/30/2021, MD on 04/30/2021  7:01 PM    Technically difficult study. Limited visualization.  Normal LV size, thickness, function, LVEF>55% .  Normal diastolic function.  Grossly normal RV size and  function.  Aortic sclerosis without  stenosis.  No significant valvular heart disease but valves are not clearly visualized.       03/23/22    NM STRESS TEST WITH MYOCARDIAL PERFUSION 03/23/2022  2:16 PM (Final)    Interpretation Summary    Stress Combined Conclusion: Normal treadmill myocardial perfusion study at 93 %% PHR. Findings suggest a low risk of myocardial ischemia.    Stress Function: Left ventricular function post-stress is normal. Post-stress ejection fraction is 66%.    Perfusion Comments: LV perfusion is normal. There is no evidence of inducible ischemia.    Perfusion Conclusion: There is no evidence of transient ischemic dilation (TID). TID ratio is 1.16.    ECG: The ECG was borderline for diagnosis.    Stress Test: A Bruce protocol stress test was performed. Overall, the patient's exercise capacity was above average for their age. The patient was stressed for 12  and 0 . The patient experienced no angina during the test. Hemodynamics are adequate for diagnosis. Blood pressure demonstrated a normal response and heart rate demonstrated a normal response to stress. The patient's heart rate recovery was normal. The patient reported no symptoms during the stress test. The test was stopped because the patient experienced fatigue.    Signed by: Adah Perl, MD on 03/23/2022  2:16 PM         ASSESSMENT AND RECOMMENDATIONS          1. Supraventricular tachycardia (HCC)  Clear episodes of supraventricular tachycardia, for which the differential favors AV nodal reentrant tachycardia.  I counseled him today on vagal maneuvers.  He should continue on the diltiazem regimen.  With the current increased burden of episodes, I do think an electrophysiology evaluation and consideration of catheter ablation would be appropriate.  Placing referral to [facility].  Otherwise, continue with the current diltiazem regimen.  - Ambulatory referral to Cardiac Electrophysiology    2. Mixed  hyperlipidemia  Not presently on targeted pharmacotherapy.  Recent lipid profile is only slightly above target.  We will continue with dietary and lifestyle interventions.    3. Near syncope  A few episodes of near/frank syncope over the last several years.  In the mechanism of these episodes is somewhat unclear.  Cannot completely exclude the possibility that dysrhythmia could be driving his episodes of syncope, but his episodes are generally not associated with any prodrome of palpitations or sensation of tachycardia, such as his experience with the recent ER visit.  It will however be interesting to follow him clinically from the standpoint, he has been having a catheter ablation of his SVT.      Thank you for allowing me to participate in the care of your patient.      Adah Perl, MD  Cardiologist  [facility] / [facility]

## 2022-06-14 ENCOUNTER — Ambulatory Visit
Admit: 2022-06-14 | Discharge: 2022-06-14 | Payer: MEDICARE | Attending: Clinical Cardiac Electrophysiology | Primary: Family Medicine

## 2022-06-14 DIAGNOSIS — I471 Supraventricular tachycardia: Secondary | ICD-10-CM

## 2022-06-14 NOTE — H&P (View-Only) (Signed)
Date:  June 14, 2022  Patient name: Wayne Stone  Date of Birth: 12/29/1950    HISTORY OF PRESENT ILLNESS: Wayne Stone is a 71 y.o. male who was seen in the electrophysiology office today.  He has been experiencing tachycardia for about 5 years.  In the past it was brief and infrequent.  Over the last couple months it has become much more frequent and longer in duration.  In July he had an episode that would not stop.  He presented to the emergency room and was found to be in a narrow complex tachycardia at about 190 bpm.  He received IV adenosine which terminated the arrhythmia.  He has been placed on Cardizem 120 mg daily.  He continues to have brief episodes of the tachycardia.  He has undergone an echocardiogram showing normal left ventricular systolic function.  He has not identified any specific triggers.  Otherwise he feels well and denies any shortness of breath or dyspnea on exertion.  He denies any lower extremity edema.  He is here with his wife today to discuss possible treatments of his tachycardia.      Past Medical History:   has a past medical history of Umbilical hernia without obstruction or gangrene.    Past Surgical History:   has a past surgical history that includes Umbilical hernia repair (05/04/2016) and other surgical history (12/2020).     Allergies:  Patient has no known allergies.    Social History:   reports that he has never smoked. He has never used smokeless tobacco. He reports that he does not drink alcohol and does not use drugs.     Family History: family history includes Coronary Art Dis in his father; High Cholesterol in his mother; Other in his mother.    Home Medications:    Prior to Admission medications    Medication Sig Start Date End Date Taking? Authorizing Provider   dilTIAZem (CARDIZEM CD) 120  MG extended release capsule Take 1 capsule by mouth daily 05/31/22  Yes Jeffrey Paulsen, MD       REVIEW OF SYSTEMS:    All 14-point review of systems are completed and  pertinent positives are mentioned in the history of present illness.  All other  systems are reviewed and are negative.    Physical Examination:    BP 108/68 (Site: Left Upper Arm)   Pulse 63   Resp 14   Ht 5' 10" (1.778 m)   Wt 215 lb 3.2 oz (97.6 kg)   BMI 30.88 kg/m      Alert and oriented 3 in no apparent distress  Normocephalic and atraumatic  Cardiovascular: Regular rate and rhythm  Lungs: Clear to auscultation bilaterally  Abdomen: Nontender nondistended  Extremities: No clubbing cyanosis or edema  Ambulates with normal gait  Appropriate mood and affect      DATA: The patient's EKG from the emergency room shows a narrow complex tachycardia with heart rate of 180 bpm.  There does appear to be some ST segment depression in the inferior leads which could be due to rate.  There may be a pseudo-S wave in lead II.  There does appear to be evidence of an RR prime in lead V1.  This does not appear to be present during the sinus EKG.  ECG: Sinus rhythm heart rate is 63 bpm.  PR interval is 140 ms.  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  QT interval is   corrected to 400 ms.    IMPRESSION:    1. Supraventricular tachycardia by ECG [facility])        RECOMMENDATIONS:    The patient has a history of tachycardia which has progressed to sustained episodes.  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  He is now on diltiazem.  We discussed potential treatments moving forward.  Either he could continue with his current medical regimen or consider ablation therapy.  I discussed this procedure including the risk, benefits and alternatives.  The patient would like to schedule for  ablation therapy.  He will continue on his diltiazem for now and post procedure this will be discontinued.    I spent approximately 50 minutes interviewing the patient as well as performing a physical exam and reviewing the patient's data and study findings.  I have explained my findings to the patient.

## 2022-06-14 NOTE — Progress Notes (Signed)
Date:  June 14, 2022  Patient name: Wayne Stone  Date of Birth: [birth year]    HISTORY OF PRESENT ILLNESS: Wayne Stone is a 71 y.o. male who was seen in the electrophysiology office today.  He has been experiencing tachycardia for about 5 years.  In the past it was brief and infrequent.  Over the last couple months it has become much more frequent and longer in duration.  In July he had an episode that would not stop.  He presented to the emergency room and was found to be in a narrow complex tachycardia at about 190 bpm.  He received IV adenosine which terminated the arrhythmia.  He has been placed on Cardizem 120 mg daily.  He continues to have brief episodes of the tachycardia.  He has undergone an echocardiogram showing normal left ventricular systolic function.  He has not identified any specific triggers.  Otherwise he feels well and denies any shortness of breath or dyspnea on exertion.  He denies any lower extremity edema.  He is here with his wife today to discuss possible treatments of his tachycardia.      Past Medical History:   has a past medical history of Umbilical hernia without obstruction or gangrene.    Past Surgical History:   has a past surgical history that includes Umbilical hernia repair (05/04/2016) and other surgical history (12/2020).     Allergies:  Patient has no known allergies.    Social History:   reports that he has never smoked. He has never used smokeless tobacco. He reports that he does not drink alcohol and does not use drugs.     Family History: family history includes Coronary Art Dis in his father; High Cholesterol in his mother; Other in his mother.    Home Medications:    Prior to Admission medications    Medication Sig Start Date End Date Taking? Authorizing Provider   dilTIAZem (CARDIZEM CD) 120  MG extended release capsule Take 1 capsule by mouth daily 05/31/22  Yes Adah Perl, MD       REVIEW OF SYSTEMS:    All 14-point review of systems are completed and  pertinent positives are mentioned in the history of present illness.  All other  systems are reviewed and are negative.    Physical Examination:    BP 108/68 (Site: Left Upper Arm)   Pulse 63   Resp 14   Ht 5\' 10"  (1.778 m)   Wt 215 lb 3.2 oz (97.6 kg)   BMI 30.88 kg/m      Alert and oriented 3 in no apparent distress  Normocephalic and atraumatic  Cardiovascular: Regular rate and rhythm  Lungs: Clear to auscultation bilaterally  Abdomen: Nontender nondistended  Extremities: No clubbing cyanosis or edema  Ambulates with normal gait  Appropriate mood and affect      DATA: The patient's EKG from the emergency room shows a narrow complex tachycardia with heart rate of 180 bpm.  There does appear to be some ST segment depression in the inferior leads which could be due to rate.  There may be a pseudo-S wave in lead II.  There does appear to be evidence of an RR prime in lead V1.  This does not appear to be present during the sinus EKG.  ECG: Sinus rhythm heart rate is 63 bpm.  PR interval is 140 ms.  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  QT interval is  corrected to 400 ms.    IMPRESSION:    1. Supraventricular tachycardia by ECG [facility])        RECOMMENDATIONS:    The patient has a history of tachycardia which has progressed to sustained episodes.  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  He is now on diltiazem.  We discussed potential treatments moving forward.  Either he could continue with his current medical regimen or consider ablation therapy.  I discussed this procedure including the risk, benefits and alternatives.  The patient would like to schedule for  ablation therapy.  He will continue on his diltiazem for now and post procedure this will be discontinued.    I spent approximately 50 minutes interviewing the patient as well as performing a physical exam and reviewing the patient's data and study findings.  I have explained my findings to the patient.

## 2022-07-13 NOTE — Telephone Encounter (Signed)
Called patient to discuss pre procedure planning.  I advised he should report to [facility] at tomorrow 07/04/2022 at 9:30 AM for his procedure scheduled at 11:30 AM.    I advised not to eat or drink anything after midnight tonight 07/13/2022 11:59 PM.    I confirmed he has not taking his Diltiazem within the last 24 hours.

## 2022-07-14 ENCOUNTER — Inpatient Hospital Stay: Payer: MEDICARE | Attending: Clinical Cardiac Electrophysiology

## 2022-07-14 LAB — CBC
Hematocrit: 43 % (ref 38.0–52.0)
Hemoglobin: 13.8 g/dL (ref 13.0–17.3)
MCH: 26.6 pg — ABNORMAL LOW (ref 27.0–34.5)
MCHC: 32.1 g/dL (ref 32.0–36.0)
MCV: 83 fL — ABNORMAL LOW (ref 84.0–100.0)
MPV: 8.6 fL (ref 7.2–13.2)
NRBC Absolute: 0 10*3/uL (ref 0.000–0.012)
NRBC Automated: 0 % (ref 0.0–0.2)
Platelets: 387 10*3/uL (ref 140–440)
RBC: 5.18 x10e6/mcL (ref 4.00–5.60)
RDW: 15.3 % (ref 11.0–16.0)
WBC: 7.7 10*3/uL (ref 3.8–10.6)

## 2022-07-14 LAB — BASIC METABOLIC PANEL
Anion Gap: 14 mmol/L (ref 2–17)
BUN: 22 mg/dL (ref 8–23)
CO2: 20 mmol/L — ABNORMAL LOW (ref 22–29)
Calcium: 9.2 mg/dL (ref 8.8–10.2)
Chloride: 106 mmol/L (ref 98–107)
Creatinine: 1 mg/dL (ref 0.7–1.3)
Est, Glom Filt Rate: 80 mL/min/1.73m?? (ref >=60)
Glucose: 98 mg/dL (ref 70–99)
Osmolaliy Calculated: 283 mosm/kg (ref 270–287)
Potassium: 4.1 mmol/L (ref 3.5–5.3)
Sodium: 140 mmol/L (ref 135–145)

## 2022-07-14 MED ORDER — ACETAMINOPHEN 325 MG PO TABS
325 MG | ORAL | Status: DC | PRN
Start: 2022-07-14 — End: 2022-07-14

## 2022-07-14 MED ORDER — HYDRALAZINE HCL 20 MG/ML IJ SOLN
20 MG/ML | INTRAMUSCULAR | Status: DC | PRN
Start: 2022-07-14 — End: 2022-07-14

## 2022-07-14 MED ORDER — NORMAL SALINE FLUSH 0.9 % IV SOLN
0.9 % | INTRAVENOUS | Status: DC | PRN
Start: 2022-07-14 — End: 2022-07-14

## 2022-07-14 MED ORDER — NORMAL SALINE FLUSH 0.9 % IV SOLN
0.9 % | Freq: Two times a day (BID) | INTRAVENOUS | Status: DC
Start: 2022-07-14 — End: 2022-07-14

## 2022-07-14 MED ORDER — PROPOFOL 200 MG/20ML IV EMUL
200 MG/20ML | INTRAVENOUS | Status: DC | PRN
Start: 2022-07-14 — End: 2022-07-14
  Administered 2022-07-14: 16:00:00 150 via INTRAVENOUS

## 2022-07-14 MED ORDER — HYDROMORPHONE HCL 1 MG/ML IJ SOLN
1 MG/ML | INTRAMUSCULAR | Status: DC | PRN
Start: 2022-07-14 — End: 2022-07-14

## 2022-07-14 MED ORDER — AMIODARONE HCL IN DEXTROSE 150-4.21 MG/100ML-% IV SOLN
INTRAVENOUS | Status: AC
Start: 2022-07-14 — End: ?

## 2022-07-14 MED ORDER — ONDANSETRON HCL 4 MG/2ML IJ SOLN
4 MG/2ML | Freq: Once | INTRAMUSCULAR | Status: DC | PRN
Start: 2022-07-14 — End: 2022-07-14

## 2022-07-14 MED ORDER — LACTATED RINGERS IV SOLN
INTRAVENOUS | Status: DC | PRN
Start: 2022-07-14 — End: 2022-07-14
  Administered 2022-07-14: 16:00:00 via INTRAVENOUS

## 2022-07-14 MED ORDER — SODIUM CHLORIDE 0.9 % IV SOLN
0.9 % | INTRAVENOUS | Status: DC | PRN
Start: 2022-07-14 — End: 2022-07-14
  Administered 2022-07-14: 17:00:00 1 via INTRAVENOUS

## 2022-07-14 MED ORDER — FENTANYL CITRATE (PF) 100 MCG/2ML IJ SOLN
100 MCG/2ML | INTRAMUSCULAR | Status: AC
Start: 2022-07-14 — End: ?

## 2022-07-14 MED ORDER — AMIODARONE HCL 150 MG/3ML IV SOLN
150 MG/3ML | INTRAVENOUS | Status: DC | PRN
Start: 2022-07-14 — End: 2022-07-14
  Administered 2022-07-14: 17:00:00 150 via INTRAVENOUS

## 2022-07-14 MED ORDER — BUPIVACAINE HCL (PF) 0.5 % IJ SOLN
0.5 % | INTRAMUSCULAR | Status: AC
Start: 2022-07-14 — End: ?

## 2022-07-14 MED ORDER — GLUCAGON HCL (DIAGNOSTIC) 1 MG IJ SOLR
1 MG | INTRAMUSCULAR | Status: DC | PRN
Start: 2022-07-14 — End: 2022-07-14

## 2022-07-14 MED ORDER — HALOPERIDOL LACTATE 5 MG/ML IJ SOLN
5 MG/ML | Freq: Once | INTRAMUSCULAR | Status: DC | PRN
Start: 2022-07-14 — End: 2022-07-14

## 2022-07-14 MED ORDER — ISOPROTERENOL HCL 0.2 MG/ML IJ SOLN
0.2 MG/ML | INTRAMUSCULAR | Status: AC
Start: 2022-07-14 — End: ?

## 2022-07-14 MED ORDER — DEXTROSE 10 % IV BOLUS
INTRAVENOUS | Status: DC | PRN
Start: 2022-07-14 — End: 2022-07-14

## 2022-07-14 MED ORDER — BUPIVACAINE HCL (PF) 0.5 % IJ SOLN
0.5 % | INTRAMUSCULAR | Status: DC | PRN
Start: 2022-07-14 — End: 2022-07-14
  Administered 2022-07-14: 16:00:00 10 via INTRADERMAL

## 2022-07-14 MED ORDER — DEXAMETHASONE SODIUM PHOSPHATE 4 MG/ML IJ SOLN
4 MG/ML | INTRAMUSCULAR | Status: DC | PRN
Start: 2022-07-14 — End: 2022-07-14
  Administered 2022-07-14: 16:00:00 4 via INTRAVENOUS

## 2022-07-14 MED ORDER — MIDAZOLAM HCL 2 MG/2ML IJ SOLN
2 MG/ML | INTRAMUSCULAR | Status: AC
Start: 2022-07-14 — End: ?

## 2022-07-14 MED ORDER — FENTANYL CITRATE (PF) 100 MCG/2ML IJ SOLN
100 MCG/2ML | INTRAMUSCULAR | Status: DC | PRN
Start: 2022-07-14 — End: 2022-07-14
  Administered 2022-07-14: 16:00:00 100 via INTRAVENOUS

## 2022-07-14 MED ORDER — DEXTROSE 10 % IV SOLN
10 % | INTRAVENOUS | Status: DC | PRN
Start: 2022-07-14 — End: 2022-07-14

## 2022-07-14 MED ORDER — GLUCOSE 4 G PO CHEW
4 g | ORAL | Status: DC | PRN
Start: 2022-07-14 — End: 2022-07-14

## 2022-07-14 MED ORDER — PHENYLEPHRINE HCL 1 MG/10ML IV SOSY
1 MG/0ML | INTRAVENOUS | Status: DC | PRN
Start: 2022-07-14 — End: 2022-07-14
  Administered 2022-07-14 (×6): 200 via INTRAVENOUS

## 2022-07-14 MED FILL — MIDAZOLAM HCL 2 MG/2ML IJ SOLN: 2 MG/ML | INTRAMUSCULAR | Qty: 2

## 2022-07-14 MED FILL — ISOPROTERENOL HCL 0.2 MG/ML IJ SOLN: 0.2 MG/ML | INTRAMUSCULAR | Qty: 5

## 2022-07-14 MED FILL — FENTANYL CITRATE (PF) 100 MCG/2ML IJ SOLN: 100 MCG/2ML | INTRAMUSCULAR | Qty: 2

## 2022-07-14 MED FILL — HALOPERIDOL LACTATE 5 MG/ML IJ SOLN: 5 MG/ML | INTRAMUSCULAR | Qty: 1

## 2022-07-14 MED FILL — BUPIVACAINE HCL (PF) 0.5 % IJ SOLN: 0.5 % | INTRAMUSCULAR | Qty: 30

## 2022-07-14 MED FILL — NEXTERONE 150-4.21 MG/100ML-% IV SOLN: INTRAVENOUS | Qty: 100

## 2022-07-14 NOTE — Interval H&P Note (Signed)
Update History & Physical    The patient's History and Physical of June 14, 2022 was reviewed with the patient and I examined the patient. There was no change. The surgical site was confirmed by the patient and me.     Plan: The risks, benefits, expected outcome, and alternative to the recommended procedure have been discussed with the patient. Patient understands and wants to proceed with the procedure.     Electronically signed by Marcelline Deist, MD on 07/14/2022 at 10:56 AM

## 2022-07-14 NOTE — Anesthesia Post-Procedure Evaluation (Signed)
Department of Anesthesiology  Postprocedure Note    Patient: Wayne Stone  MRN: [id number]  Birthdate: 05/13/1951  Date of evaluation: 07/14/2022      Procedure Summary     Date: 07/14/22 Room / Location: [facility] EP LAB 2 / [facility] CARDIAC CATH/EP LAB    Anesthesia Start: 1143 Anesthesia Stop: 0539    Procedure: Ablation SVT Diagnosis:       Paroxysmal supraventricular tachycardia      (Paroxysmal supraventricular tachycardia (HCC) [I47.1])    Providers: Marcelline Deist, MD Responsible Provider: Everlean Alstrom, MD    Anesthesia Type: General ASA Status: 2          Anesthesia Type: General    Aldrete Phase I:      Aldrete Phase II:        Anesthesia Post Evaluation    Patient location during evaluation: PACU  Patient participation: complete - patient participated  Level of consciousness: sleepy but conscious  Airway patency: patent  Nausea & Vomiting: no nausea and no vomiting  Complications: no  Cardiovascular status: hemodynamically stable  Respiratory status: acceptable  Hydration status: euvolemic  Pain management: adequate

## 2022-07-14 NOTE — Anesthesia Pre-Procedure Evaluation (Signed)
Department of Anesthesiology  Preprocedure Note       Name:  Wayne Stone   Age:  71 y.o.  DOB:  29-Apr-1951                                          MRN:  [id number]         Date:  07/14/2022      Surgeon: Moishe Spice):  Earma Reading, MD    Procedure: Procedure(s):  Ablation SVT    Medications prior to admission:   Prior to Admission medications    Medication Sig Start Date End Date Taking? Authorizing Provider   dilTIAZem (CARDIZEM CD) 120 MG extended release capsule Take 1 capsule by mouth daily 05/31/22   Adah Perl, MD       Current medications:    Current Facility-Administered Medications   Medication Dose Route Frequency Provider Last Rate Last Admin   . sodium chloride flush 0.9 % injection 5-40 mL  5-40 mL IntraVENous 2 times per day Earma Reading, MD           Allergies:  No Known Allergies    Problem List:    Patient Active Problem List   Diagnosis Code   . 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 Mixed hyperlipidemia E78.2   . Prediabetes R73.03   . History of prostate cancer Z85.46       Past Medical History:        Diagnosis Date   . Prostate cancer (HCC)    . SVT (supraventricular tachycardia)    . Umbilical hernia without obstruction or gangrene        Past Surgical History:        Procedure Laterality Date   . EP DEVICE PROCEDURE  07/14/2022    Dr. Tenny Craw   . OTHER SURGICAL HISTORY  12/2020    seed implation for prostate cancer   . UMBILICAL HERNIA REPAIR  05/04/2016    robotic; dr. Alwyn Pea simpson       Social History:    Social History     Tobacco Use   . Smoking status: Never   . Smokeless tobacco: Never   . Tobacco comments:     02/24/2022   Substance Use Topics   . Alcohol use: Yes     Alcohol/week: 1.0 standard drink of alcohol     Types: 1 Shots of liquor per week                                Counseling given: Not Answered  Tobacco comments: 02/24/2022      Vital Signs (Current): There were no vitals filed for this visit.                                            BP Readings from Last 3 Encounters:   06/14/22 108/68   05/31/22 112/82   05/03/22 120/85       NPO Status: Time of last liquid consumption: 1800                        Time of last solid  consumption: 1800                        Date of last liquid consumption: 07/13/22                        Date of last solid food consumption: 07/13/22    BMI:   Wt Readings from Last 3 Encounters:   06/14/22 97.6 kg (215 lb 3.2 oz)   05/31/22 95.6 kg (210 lb 12.8 oz)   05/03/22 93 kg (205 lb)     There is no height or weight on file to calculate BMI.    CBC:   Lab Results   Component Value Date/Time    WBC 7.7 07/14/2022 09:50 AM    RBC 5.18 07/14/2022 09:50 AM    HGB 13.8 07/14/2022 09:50 AM    HCT 43.0 07/14/2022 09:50 AM    MCV 83.0 07/14/2022 09:50 AM    RDW 15.3 07/14/2022 09:50 AM    PLT 387 07/14/2022 09:50 AM       CMP:   Lab Results   Component Value Date/Time    NA 139 05/03/2022 05:32 PM    K 4.2 05/03/2022 05:32 PM    CL 104 05/03/2022 05:32 PM    CO2 22 05/03/2022 05:32 PM    BUN 20 05/03/2022 05:32 PM    CREATININE 0.9 05/03/2022 05:32 PM    GFRAA 100 08/26/2020 10:24 AM    LABGLOM 91 05/03/2022 05:32 PM    GLUCOSE 128 05/03/2022 05:32 PM    PROT 6.9 05/03/2022 05:32 PM    CALCIUM 9.5 05/03/2022 05:32 PM    BILITOT 0.62 05/03/2022 05:32 PM    ALKPHOS 127 05/03/2022 05:32 PM    AST 38 05/03/2022 05:32 PM    ALT 35 05/03/2022 05:32 PM       POC Tests: No results for input(s): "POCGLU", "POCNA", "POCK", "POCCL", "POCBUN", "POCHEMO", "POCHCT" in the last 72 hours.    Coags: No results found for: "PROTIME", "INR", "APTT"    HCG (If Applicable): No results found for: "PREGTESTUR", "PREGSERUM", "HCG", "HCGQUANT"     ABGs: No results found for: "PHART", "PO2ART", "PCO2ART", "HCO3ART", "BEART", "O2SATART"     Type & Screen (If Applicable):  No results found for: "LABABO", "LABRH"    Drug/Infectious Status (If Applicable):  Lab Results   Component Value Date/Time    HEPCAB Negative  02/24/2022 09:44 AM       COVID-19 Screening (If Applicable): No results found for: "COVID19"        Anesthesia Evaluation   no history of anesthetic complications:   Airway: Mallampati: II  TM distance: <3 FB   Neck ROM: full  Mouth opening: > = 3 FB   Dental:          Pulmonary:Negative Pulmonary ROS                             PE comment: Non-labored Cardiovascular:Negative CV ROS  Exercise tolerance: good (>4 METS),   (+) dysrhythmias: SVT,         Rhythm: regular  Rate: normal                    Neuro/Psych:   Negative Neuro/Psych ROS              GI/Hepatic/Renal:             Endo/Other:  Abdominal:             Vascular:          Other Findings: Patient awake, alert, and oriented. No gross neurological deficits appreciated. Focused physical exam as above.           Anesthesia Plan      general     ASA 2           MIPS: Prophylactic antiemetics administered.  Anesthetic plan and risks discussed with patient.                        Frederich Cha, MD   07/14/2022

## 2022-07-14 NOTE — Discharge Instructions (Signed)
Catheter Ablation: What to Expect at Home  Your Recovery     You had a catheter ablation to try to correct a problem with your heartbeat (heart rhythm). You may have swelling, bruising, or a small lump around the sites where the catheters went into your body. These should go away in 3 to 4 weeks.  You can do light activities at home. Don't do anything strenuous until your doctor says it is okay. This may be for several days.  This care sheet gives you a general idea about how long it will take for you to recover. But each person recovers at a different pace. Follow the steps below to get better as quickly as possible.  How can you care for yourself at home?  Activity    If the doctor gave you a sedative:  For 24 hours, don't do anything that requires attention to detail, such as going to work, making important decisions, or signing any legal documents. It takes time for the medicine's effects to completely wear off.  For your safety, do not drive or operate any machinery that could be dangerous. Wait until the medicine wears off and you can think clearly and react easily.     Do not do strenuous exercise and do not lift, pull, or push anything heavier than 10 lbs for a week.     If the catheter was placed in your groin, try not to walk up stairs for the first couple of days.     If the catheter was placed in your arm near your wrist, do not bend your wrist deeply for the first couple of days. Be careful using your hand to get into and out of a chair or bed.     Ask your doctor when it is okay to have sex.   Diet    You can eat your normal diet. If your stomach is upset, try bland, low-fat foods like plain rice, broiled chicken, toast, and yogurt.     Drink plenty of fluids (unless your doctor tells you not to).     Eat heart-healthy foods. These foods include vegetables, fruits, nuts, beans, lean meat, fish, and whole grains. Limit sodium, alcohol, and sugar.   Medicines    Your doctor will tell you if and when  you can restart your medicines. You will also get instructions about taking any new medicines.     If you stopped taking aspirin or some other blood thinner, your doctor will tell you when to start taking it again.     Be safe with medicines. Take your medicines exactly as prescribed. Call your doctor if you think you are having a problem with your medicine.   Catheter site care    For 1 day, keep a bandage over the spot where the catheter was inserted. The bandage probably will fall off in this time.     Put ice or a cold pack on the area for 10 to 20 minutes at a time to help with soreness or swelling. Put a thin cloth between the ice and your skin.     You may shower 24 hours after the procedure. Pat the incision dry.     Do not soak the catheter site for 1 week.     Watch for bleeding from the site. A small amount of blood (up to the size of a quarter) on the bandage can be normal.     If you are bleeding, lie down and press   on the area for 15 minutes to try to make it stop. If the bleeding does not stop, call your doctor or seek immediate medical care.   Heart-healthy lifestyle    Don't smoke.     Be active. Try for at least 30 minutes of activity on most days of the week. Talk to your doctor about what type and level of exercise is safe for you.     Stay at a healthy weight. Lose weight if you need to.     Manage other health problems such as high blood pressure, high cholesterol, diabetes, and sleep apnea.   Follow-up care is a key part of your treatment and safety. Be sure to make and go to all appointments, and call your doctor if you are having problems. It's also a good idea to know your test results and keep a list of the medicines you take.  When should you call for help?   Call 911  anytime you think you may need emergency care. For example, call if:    You passed out (lost consciousness).     You have symptoms of a heart attack. These may include:  Chest pain or pressure, or a strange feeling in the  chest.  Sweating.  Shortness of breath.  Nausea or vomiting.  Pain, pressure, or a strange feeling in the back, neck, jaw, or upper belly, or in one or both shoulders or arms.  Lightheadedness or sudden weakness.  A fast or irregular heartbeat.  After you call 911, the operator may tell you to chew 1 adult-strength or 2 to 4 low-dose aspirin. Wait for an ambulance. Do not try to drive yourself.     You have symptoms of a stroke. These may include:  Sudden numbness, tingling, weakness, or loss of movement in your face, arm, or leg, especially on only one side of your body.  Sudden vision changes.  Sudden trouble speaking.  Sudden confusion or trouble understanding simple statements.  Sudden problems with walking or balance.  A sudden, severe headache that is different from past headaches.   Call your doctor now or seek immediate medical care if:    You are bleeding from the area where the catheter was put in your blood vessel.     You have a fast-growing, painful lump at the catheter site.     You have signs of infection, such as:  Increased pain, swelling, warmth, or redness.  Red streaks leading from the catheter site.  Pus draining from the catheter site.  A fever.     Your leg, arm, or hand is painful, looks blue, or feels cold, numb, or tingly.   Watch closely for any changes in your health, and be sure to contact your doctor if you have any problems.  Current as of: March 20, 2022               Content Version: 13.8   2006-2023 [employer].   Care instructions adapted under license by [facility]. If you have questions about a medical condition or this instruction, always ask your healthcare professional. [employer] disclaims any warranty or liability for your use of this information.

## 2022-07-16 LAB — EKG 12-LEAD
P Axis: 23 degrees
P Axis: 57 degrees
P-R Interval: 142 ms
P-R Interval: 143 ms
Q-T Interval: 374 ms
Q-T Interval: 392 ms
QRS Duration: 102 ms
QRS Duration: 108 ms
QTc Calculation (Bazett): 419 ms
QTc Calculation (Bazett): 450 ms
R Axis: 10 degrees
R Axis: 5 degrees
T Axis: 56 degrees
T Axis: 64 degrees
Ventricular Rate: 68 {beats}/min
Ventricular Rate: 86 {beats}/min

## 2022-08-11 ENCOUNTER — Ambulatory Visit: Admit: 2022-08-11 | Discharge: 2022-08-11 | Payer: MEDICARE | Attending: Family | Primary: Family Medicine

## 2022-08-11 DIAGNOSIS — I471 Supraventricular tachycardia, unspecified: Secondary | ICD-10-CM

## 2022-08-11 NOTE — Progress Notes (Signed)
Electrophysiology Note      Reason for office visit: Palpitations      HISTORY OF PRESENT ILLNESS: Wayne Stone is a 71 y.o. male who presents for follow up for his SVT.  He underwent an ablation with Dr. 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  He has been doing well since his ablation.  He has no complaints.  He has had no recurrence of arrhythmia.  He is tolerating full activity without issues.  He is very happy with how he is doing.    Past Medical History:   has a past medical history of Prostate cancer ([place]), SVT (supraventricular tachycardia), and Umbilical hernia without obstruction or gangrene.    Past Surgical History:   has a past surgical history that includes Umbilical hernia repair (05/04/2016); other surgical history (12/2020); ep device procedure (07/14/2022); and ep device procedure (N/A, 07/14/2022).     Social History:   reports that he has never smoked. He has never used smokeless tobacco. He reports current alcohol use of about 1.0 standard drink of alcohol per week. He reports that he does not use drugs.     Family History: family history includes Coronary Art Dis in his father; High Cholesterol in his mother; Other in his mother.    Allergies:  Patient has no known allergies.    Home Medications:    Prior to Admission medications    Not on File       REVIEW OF SYSTEMS:      All 14-point review of systems are completed and pertinent positives are mentioned in the history of present illness. Other systems are reviewed and are negative.     Physical Examination:    BP 108/68 (Site: Left Upper Arm)   Pulse 66   Resp 14   Ht 1.778 m (5\' 10" )   Wt 99 kg (218 lb 3.2 oz)   BMI 31.31 kg/m    Constitutional and General Appearance: alert, cooperative, no distress, and appears stated age  HEENT: PERRL, no cervical lymphadenopathy. No masses palpable. Normal oral mucosa  Respiratory:  Normal excursion and expansion without use of accessory muscles  Resp  Auscultation: Normal breath sounds without dullness or wheezing  Cardiovascular:  The apical impulse is not displaced  Heart tones are crisp and normal. regular S1 and S2.  Jugular venous pulsation Normal  The carotid upstroke is normal in amplitude and contour without delay or bruit  Peripheral pulses are symmetrical and full  Abdomen:  No masses or tenderness  Bowel sounds present  Extremities:   No Cyanosis or Clubbing   Lower extremity edema: No  Skin: Warm and dry  Neurological:  Alert and oriented.  Moves all extremities well  No abnormalities of mood, affect, memory, mentation, or behavior are noted    DATA:    ECG: EKG shows sinus rhythm with appropriate intervals at a rate of 66 bpm PR interval 126 ms QRS 98 ms QTc 404 ms.  CHA2DS2-VASc Score for Atrial Fibrillation Stroke Risk   Risk   Factors  Component Value   C CHF No 0   H HTN No 0   A2 Age >= 75 No,  (71 y.o.) 0   D DM No 0   S2 Prior Stroke/TIA No 0   V Vascular Disease No 0   A Age 61-74 Yes,  (71 y.o.) 1   Sc Sex male 0    CHA2DS2-VASc  Score  1  Score last updated 08/11/22 2:05 PM EST    Click here for a link to the UpToDate guideline "Atrial Fibrillation: Anticoagulation therapy to prevent embolization    Disclaimer: Risk Score calculation is dependent on accuracy of patient problem list and past encounter diagnosis.    IMPRESSION:    1. SVT (supraventricular tachycardia)    2. Typical atrial flutter (HCC)        RECOMMENDATIONS:    Wayne Stone is doing well status post ablation.  We discussed success rates and his current quality of life.  At this point in time he can follow-up on as-needed basis.  He will alert [place] to any problems or concerns otherwise.      All questions and concerns were addressed to the patient/family. Alternatives to my treatment were discussed.    I spent a total of 20 minutes on this encounter today including preparation to see the patient, review of diagnostic information, discussion of care, orders and  documentation      Marlaine Hind Santo Zahradnik, APRN - NP      NOTE: This report was transcribed using voice recognition software. Every effort was made to ensure accuracy, however, inadvertent computerized transcription errors may be present.

## 2022-10-11 NOTE — Progress Notes (Signed)
Formatting of this note is different from the original.  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 is a 72 y.o. male with the above diagnosis and treatment history presenting for follow-up visit.  He is not on any medications for his bladder or ED. He denies any hematuria, bloody stools, urine leakage, or chronic bowel issues including loose stools or constipation.    He denies incontinence of bowel or bladder. He is not on any medication for ED, and is not interested in further intervention at this time.    AUA symptom score today was 4; incomplete emptying 0; frequency 1; intermittency 0; urgency 1; weak stream 1; straining 0; nocturia 1  Quality of life due to urinary symptoms is 2  Merrick dysuria score-0  [facility] erectile function score-0  SHIM/IIEF-5 is 5/25. He is on no meds for ED.    He has had an ablation for SVT and AF but this requires no medications or further intervention at this time.    Physical Examination:    General:  Awake, alert and oriented. ECOG 0  Vitals:    10/11/22 0847   BP: 129/71   Pulse: 65   Resp: 16   Temp: 35.9 C (96.7 F)   SpO2: 97%   Weight: 97.9 kg (215 lb 12.8 oz)     HEENT:  Head is normocephalic, atraumatic.  Extraocular movements are intact, sclerae are anicteric, oral mucosa is moist.   Neck:   Supple.  Lymphatics:  No cervical, supraclavicular, axillary lymphadenopathy.  Lungs:  Even chest expansion, bilaterally. Clear to auscultation bilaterally.   Cardiac:  regular rate and  rhythm.  Abdomen:  Soft, nontender, nondistended. No hepatomegaly and no palpable masses.   Extremities:  no cyanosis, or edema.  Musculoskeletal:  Nontender to brisk percussion of the axial skeleton.  Skin:   No obvious  rashes or jaundice.  Neurologic:  Grossly nonfocal.    Labs and Other Data Reviewed:    Lab Results   Component Value Date    PSA 0.15 04/15/2022    PSA 0.03 10/13/2021    PSA 0.05 03/30/2021    PSA 9.04 (H) 11/05/2020    TESTOSTERONE 379.38 04/15/2022    TESTOSTERONE 62.93 (L) 10/13/2021    TESTOSTERONE 16.78 (L) 03/30/2021     Tumor Status: The patient is clinically and biochemically without evidence of prostate cancer.      Recommendations: We will get a PSA and check it today. We will plan for follow-up in 6 months with a repeat PSA. We discussed heart-health lifestyle including lots of fruits/vegetables daily, avoiding tobacco products, alcohol in moderation, and at least 150 minutes of aerobic exercise per week. We discussed management of ED but he does not want additional intervention at this time. He will reach out to [place] if he has further questions or issues.    Verlene Mayer, MD, MS  Attending Physician    Electronically signed by Shanon Brow  Latricia Heft, MD MS at 10/11/2022  9:45 AM EST

## 2022-10-11 NOTE — Progress Notes (Signed)
FOLLOW-UP NOTE    [facility]: RADIATION ONCOLOGY    PATIENT: Wayne Stone    DATE: 10/11/2022    [facility]: [id number]      ATTENDING PHYSICIAN: Benna Dunks, MD    REFERRING PHYSICIAN: Sherri Sear, MD    DIAGNOSIS: Favorable Intermediate risk prostate cancer, cT1cN0M0, Gleason 3+4/GG2, PSA 8.83, Stage IIB     TREATMENT HISTORY:  Moderately hypofractionated IMRT 60 Gy in 20 fractions completed 03/02/2021 with 4 months of degarelix (last injection 02/02/2021)    INTERVAL SINCE TREATMENT: ~ 1.5 years out    Interval History: [name] is a 72 y.o. male with the above diagnosis and treatment history presenting for follow-up visit.  He is not on any medications for his bladder or ED. He denies any hematuria, bloody stools, urine leakage, or chronic bowel issues including loose stools or constipation.    He denies incontinence of bowel or bladder. He is not on any medication for ED, and is not interested in further intervention at this time.    AUA symptom score today was 4; incomplete emptying 0; frequency 1; intermittency 0; urgency 1; weak stream 1; straining 0; nocturia 1  Quality of life due to urinary symptoms is 2  Merrick dysuria score-0  [facility] erectile function score-0  SHIM/IIEF-5 is 5/25. He is on no meds for ED.    He has had an ablation for SVT and AF but this requires no medications or further intervention at this time.    Physical Examination:    General:  Awake, alert and oriented. ECOG 0  Vitals:    10/11/22 0847   BP: 129/71   Pulse: 65   Resp: 16   Temp: 35.9 C (96.7 F)   SpO2: 97%   Weight: 97.9 kg (215 lb 12.8 oz)     HEENT:  Head is normocephalic, atraumatic.  Extraocular movements are intact, sclerae are anicteric, oral mucosa is moist.   Neck:   Supple.  Lymphatics:  No cervical, supraclavicular, axillary lymphadenopathy.  Lungs:  Even chest expansion, bilaterally. Clear to auscultation bilaterally.   Cardiac:  regular rate and rhythm.  Abdomen:  Soft, nontender, nondistended. No  hepatomegaly and no palpable masses.   Extremities:  no cyanosis, or edema.  Musculoskeletal:  Nontender to brisk percussion of the axial skeleton.  Skin:   No obvious  rashes or jaundice.  Neurologic:  Grossly nonfocal.      Labs and Other Data Reviewed:    Lab Results   Component Value Date    PSA 0.15 04/15/2022    PSA 0.03 10/13/2021    PSA 0.05 03/30/2021    PSA 9.04 (H) 11/05/2020    TESTOSTERONE 379.38 04/15/2022    TESTOSTERONE 62.93 (L) 10/13/2021    TESTOSTERONE 16.78 (L) 03/30/2021     Tumor Status: The patient is clinically and biochemically without evidence of prostate cancer.      Recommendations: We will get a PSA and check it today. We will plan for follow-up in 6 months with a repeat PSA. We discussed heart-health lifestyle including lots of fruits/vegetables daily, avoiding tobacco products, alcohol in moderation, and at least 150 minutes of aerobic exercise per week. We discussed management of ED but he does not want additional intervention at this time. He will reach out to [place] if he has further questions or issues.      Noel Gerold, MD, MS  Attending Physician

## 2022-11-29 ENCOUNTER — Encounter: Admit: 2022-11-29 | Discharge: 2022-11-29 | Payer: MEDICARE | Attending: Internal Medicine | Primary: Family Medicine

## 2022-11-29 DIAGNOSIS — I471 Supraventricular tachycardia, unspecified: Secondary | ICD-10-CM

## 2022-11-29 NOTE — Progress Notes (Signed)
[facility] - FOLLOW-UP    Date:  November 29, 2022  Patient name: Wayne Stone  Date of Birth: 21-Dec-1950    REASON FOR CLINIC VISIT:  Follow-up (6 month follow up)      ASSESSMENT / RECOMMENDATIONS        1. SVT (supraventricular tachycardia)  No recent palpitations or sensation of dysrhythmia to suggest recurrence of SVT.  During the visit today, I discussed with him the anatomy and physiology of the SVT, and the ablation lines.  Will continue to monitor.    2. Typical atrial flutter (HCC)  Induced typical atrial fibrillation from the recent EP study.  Status post CTI ablation.  Will continue to monitor.    3. Mixed hyperlipidemia  Not presently on pharmacotherapy.  His most recent lipid profile demonstrated LDL cholesterol in reasonable range.  Will continue with dietary and lifestyle interventions as the primary treatment strategy.      Sharon Mt, MD  Cardiologist  [facility] / [facility]        HISTORY OF PRESENT ILLNESS          Wayne Stone is a 72 y.o. male, retired Technical sales engineer, with history significant for hyperlipidemia, and prostate cancer (completed treatment in 2022), who presents for follow-up of palpitations and supraventricular tachycardia.      In the interim since our last visit, he was seen in electrophysiology follow-up, and underwent ablation with Dr. Pennie Rushing on 07/14/2022.  He underwent AVNRT, and CTI ablations (developed typical flutter with EPS).    On presentation today, Jocob reports that he is feeling well.      In the month after his ablations, he was feeling rare episodes of short lived palpitations, but nothing since.      At present, Wayne Stone is re-engaging in a regular routine of exercise, walking 3-5 miles, 5 times per week.  He denies any chest pains or angina equivalent shortness of breath on exertion.      Diet is usually good.  His weight is up from a recent [place] cruise.  Alcohol intake is minimal.       DIAGNOSTIC STUDIES           Encounter Date: 07/14/22   EKG 12 lead   Result Value    Ventricular Rate 86    P-R Interval 143    QRS Duration 108    Q-T Interval 374    QTc Calculation (Bazett) 450    P Axis 57    R Axis 5    T Axis 64    Diagnosis      SINUS RHYTHM  NORMAL ECG  Reviewed by _________________    Confirmed by Lynann Bologna ([id number]) on 07/15/2022 9:02:47 PM         Lab Results   Component Value Date    CHOL 198 02/18/2022    TRIG 55 02/18/2022    HDL 77 02/18/2022    LDLCHOLESTEROL 110.0 (H) 02/18/2022    VLDL 11.0 02/18/2022    CHOLHDLRATIO 2.6 02/18/2022       04/30/21    TRANSTHORACIC ECHOCARDIOGRAM (TTE) COMPLETE 04/30/2021  7:01 PM (Final)  Signed by: Lynann Bologna, MD on 04/30/2021  7:01 PM    Normal LV size, thickness, function, LVEF>55% .  Normal diastolic function.  Grossly normal RV size and function.  Aortic sclerosis without stenosis.  No significant valvular heart disease but valves are not clearly visualized.       03/23/22  NM STRESS TEST WITH MYOCARDIAL PERFUSION 03/23/2022  2:16 PM (Final)    Interpretation Summary    Stress Combined Conclusion: Normal treadmill myocardial perfusion study at 93 %% PHR. Findings suggest a low risk of myocardial ischemia.    Stress Function: Left ventricular function post-stress is normal. Post-stress ejection fraction is 66%.    Perfusion Comments: LV perfusion is normal. There is no evidence of inducible ischemia.    Perfusion Conclusion: There is no evidence of transient ischemic dilation (TID). TID ratio is 1.16.    ECG: The ECG was borderline for diagnosis.    Stress Test: A Bruce protocol stress test was performed. Overall, the patient's exercise capacity was above average for their age. The patient was stressed for 12  and 0 . The patient experienced no angina during the test. Hemodynamics are adequate for diagnosis. Blood pressure demonstrated a normal response and heart rate demonstrated a normal response to stress. The patient's heart rate  recovery was normal. The patient reported no symptoms during the stress test. The test was stopped because the patient experienced fatigue.    Signed by: Sharon Mt, MD on 03/23/2022  2:16 PM       PAST HISTORY          Patient Active Problem List   Diagnosis    Benign prostatic hyperplasia with urinary obstruction    Contracture, right hand    Mixed hyperlipidemia    Prediabetes    History of prostate cancer    SVT (supraventricular tachycardia)    Typical atrial flutter (HCC)         Past medical, surgical, social and family history was reviewed and updated in the EMR as appropriate.    Allergies:  Patient has no known allergies.    Home Medications:    Prior to Admission medications    Not on File         PHYSICAL EXAM            BP 126/80   Pulse 66   Resp 16   Ht 1.778 m ('5\' 10"'$ )   Wt 98 kg (216 lb)   SpO2 97%   BMI 30.99 kg/m    Wt Readings from Last 3 Encounters:   11/29/22 98 kg (216 lb)   08/11/22 99 kg (218 lb 3.2 oz)   06/14/22 97.6 kg (215 lb 3.2 oz)       Physical Exam  Vitals and nursing note reviewed.   Constitutional:       Appearance: Normal appearance. He is normal weight.   HENT:      Head: Normocephalic and atraumatic.      Mouth/Throat:      Pharynx: Oropharynx is clear.   Neck:      Vascular: No carotid bruit or JVD.   Cardiovascular:      Rate and Rhythm: Normal rate and regular rhythm.      Chest Wall: PMI is not displaced.      Pulses: Normal pulses.      Heart sounds: Normal heart sounds, S1 normal and S2 normal. No murmur heard.     Comments: Occasional ectopy on auscultation.   Pulmonary:      Effort: Pulmonary effort is normal.      Breath sounds: Normal breath sounds.   Abdominal:      General: Abdomen is flat.      Palpations: Abdomen is soft.   Musculoskeletal:         General: Normal range of motion.  Cervical back: Normal range of motion and neck supple.      Right lower leg: No edema.      Left lower leg: No edema.   Skin:     General: Skin is warm and dry.    Neurological:      General: No focal deficit present.      Mental Status: He is alert and oriented to person, place, and time.   Psychiatric:         Mood and Affect: Mood normal.

## 2022-12-02 NOTE — Telephone Encounter (Signed)
Patient is scheduled for a Medicare AWV    Please send labs

## 2023-02-20 ENCOUNTER — Encounter

## 2023-02-24 ENCOUNTER — Encounter

## 2023-02-24 LAB — CBC WITH AUTO DIFFERENTIAL
Basophils %: 0.5 % (ref 0.0–2.0)
Basophils Absolute: 0 10*3/uL (ref 0.0–0.2)
Eosinophils %: 2.9 % (ref 0.0–7.0)
Eosinophils Absolute: 0.2 10*3/uL (ref 0.0–0.5)
Hematocrit: 43.3 % (ref 38.0–52.0)
Hemoglobin: 14.6 g/dL (ref 13.0–17.3)
Immature Grans (Abs): 0.01 10*3/uL (ref 0.00–0.06)
Immature Granulocytes %: 0.1 % (ref 0.0–0.6)
Lymphocytes Absolute: 2.4 10*3/uL (ref 1.0–3.2)
Lymphocytes: 32.1 % (ref 15.0–45.0)
MCH: 29.1 pg (ref 27.0–34.5)
MCHC: 33.7 g/dL (ref 32.0–36.0)
MCV: 86.4 fL (ref 84.0–100.0)
MPV: 9.2 fL (ref 7.2–13.2)
Monocytes %: 10 % (ref 4.0–12.0)
Monocytes Absolute: 0.8 10*3/uL (ref 0.3–1.0)
NRBC Absolute: 0 10*3/uL (ref 0.000–0.012)
NRBC Automated: 0 % (ref 0.0–0.2)
Neutrophils %: 54.4 % (ref 42.0–74.0)
Neutrophils Absolute: 4.1 10*3/uL (ref 1.6–7.3)
Platelets: 362 10*3/uL (ref 140–440)
RBC: 5.01 x10e6/mcL (ref 4.00–5.60)
RDW: 13.9 % (ref 11.0–16.0)
WBC: 7.5 10*3/uL (ref 3.8–10.6)

## 2023-02-25 LAB — URINALYSIS W/ RFLX MICROSCOPIC
Bilirubin, Urine: NEGATIVE
Blood, Urine: NEGATIVE
Glucose, Ur: NEGATIVE
Ketones, Urine: NEGATIVE
Leukocyte Esterase, Urine: NEGATIVE
Nitrite, Urine: NEGATIVE
Protein, UA: 30 — AB
Specific Gravity, UA: >=1.03 — AB (ref 1.003–1.035)
Urobilinogen, Urine: 0.2 EU/dL (ref >1.0)
pH, Urine: 6 (ref 4.5–8.0)

## 2023-02-25 LAB — LIPID PANEL
Chol/HDL Ratio: 3 (ref 0.0–4.4)
Cholesterol, Total: 198 mg/dL (ref 100–200)
HDL: 65 mg/dL (ref >=40)
LDL Cholesterol: 106.2 mg/dL — ABNORMAL HIGH (ref 0.0–100.0)
LDL/HDL Ratio: 1.6
Triglycerides: 134 mg/dL (ref 0–149)
VLDL: 26.8 mg/dL (ref 5.0–40.0)

## 2023-02-25 LAB — COMPREHENSIVE METABOLIC PANEL
ALT: 19 U/L (ref 0–50)
AST: 24 U/L (ref 0–50)
Albumin/Globulin Ratio: 1.7 (ref 1.00–2.70)
Albumin: 4.4 g/dL (ref 3.5–5.2)
Alk Phosphatase: 72 U/L (ref 40–130)
Anion Gap: 12 mmol/L (ref 2–17)
BUN: 18 mg/dL (ref 8–23)
CO2: 22 mmol/L (ref 22–29)
Calcium: 9.3 mg/dL (ref 8.5–10.7)
Chloride: 104 mmol/L (ref 98–107)
Creatinine: 1.1 mg/dL (ref 0.7–1.3)
Est, Glom Filt Rate: 71 mL/min/1.73m (ref >=60)
Globulin: 2.6 g/dL (ref 1.9–4.4)
Glucose: 81 mg/dL (ref 70–99)
Osmolaliy Calculated: 276 mOsm/kg (ref 270–287)
Potassium: 4.1 mmol/L (ref 3.5–5.3)
Sodium: 138 mmol/L (ref 135–145)
Total Bilirubin: 1.84 mg/dL — ABNORMAL HIGH (ref 0.00–1.20)
Total Protein: 7 g/dL (ref 5.7–8.3)

## 2023-02-25 LAB — HEMOGLOBIN A1C
Estimated Avg Glucose: 120
Estimated Avg Glucose: 129
Hemoglobin A1C: 5.8 % (ref 4.0–6.0)

## 2023-02-25 LAB — MICROSCOPIC URINALYSIS: BACTERIA, URINE: NONE SEEN

## 2023-02-25 LAB — PSA, DIAGNOSTIC: PSA: 0.238 ng/mL (ref 0.000–4.000)

## 2023-02-25 LAB — TSH WITH REFLEX: TSH: 1.39 mcIU/mL (ref 0.358–3.740)

## 2023-02-27 ENCOUNTER — Ambulatory Visit: Admit: 2023-02-27 | Discharge: 2023-02-27 | Payer: MEDICARE | Attending: Family Medicine | Primary: Family Medicine

## 2023-02-27 VITALS — BP 116/60 | HR 60 | Temp 97.60000°F | Ht 70.0 in | Wt 215.8 lb

## 2023-02-27 DIAGNOSIS — I471 Supraventricular tachycardia, unspecified: Principal | ICD-10-CM

## 2023-02-27 NOTE — Progress Notes (Signed)
Positive Risk Factor Screenings with Interventions:                  Activity, Diet, and Weight:  On average, how many days per week do you engage in moderate to strenuous exercise (like a brisk walk)?: 4 days  On average, how many minutes do you engage in exercise at this level?: 60 min    Do you eat balanced/healthy meals regularly?: Yes    Body mass index is 30.96 kg/m. (!) Abnormal             CHIEF COMPLAINT:  Chief Complaint   Patient presents with    Medicare AWV     Awv         HISTORY OF PRESENT ILLNESS:  [name] is a 72 y.o. male  who presents for SA W[place] and review of chronic issues.  Patient is stable from an SVT standpoint status post ablation.  Has had follow-up with cardiology.  Patient is status post prostate cancer treatment and has follow-up with his radiation oncologist soon.  Patient with stable prediabetes with an A1c of 5.8.  Patient with some post prostate cancer treatment erectile dysfunction and hot flashes.    PHQ:      02/23/2023    12:39 PM   PHQ-9    Little interest or pleasure in doing things 0   Feeling down, depressed, or hopeless 0   PHQ-2 Score 0   PHQ-9 Total Score 0       CURRENT MEDICATION LIST:    No current outpatient medications on file.     No current facility-administered medications for this visit.        ALLERGIES:    No Known Allergies     HISTORY:  Past Medical History:   Diagnosis Date    Prostate cancer (HCC)     SVT (supraventricular tachycardia) (HCC)     Umbilical hernia without obstruction or gangrene       Past Surgical History:   Procedure Laterality Date    EP DEVICE PROCEDURE  07/14/2022    Dr. Tenny Craw    EP DEVICE PROCEDURE N/A 07/14/2022    SVT & typical atrial flutter - Dr Tenny Craw    OTHER SURGICAL HISTORY  12/2020    seed implation for prostate cancer    UMBILICAL HERNIA REPAIR  05/04/2016    robotic; dr. Alwyn Pea simpson      Social History     Socioeconomic History    Marital status: Married     Spouse name: Not on file    Number of children: Not on file     Years of education: Not on file    Highest education level: Not on file   Occupational History    Not on file   Tobacco Use    Smoking status: Never    Smokeless tobacco: Never    Tobacco comments:     02/24/2022   Vaping Use    Vaping Use: Never used   Substance and Sexual Activity    Alcohol use: Yes     Alcohol/week: 1.0 standard drink of alcohol     Types: 1 Shots of liquor per week    Drug use: Never    Sexual activity: Not Currently     Partners: Female   Other Topics Concern    Not on file   Social History Narrative    Not on file     Social Determinants of Health     Financial  Resource Strain: Not on file   Food Insecurity: Not on file   Transportation Needs: Not on file   Physical Activity: Sufficiently Active (02/23/2023)    Exercise Vital Sign     Days of Exercise per Week: 4 days     Minutes of Exercise per Session: 60 min   Stress: Not on file   Social Connections: Not on file   Intimate Partner Violence: Not on file   Housing Stability: Not on file      Family History   Problem Relation Age of Onset    Other Mother         stents placed    High Cholesterol Mother     Coronary Art Dis Father         REVIEW OF SYSTEMS:  Per HPI. Otherwise balance of 10 point ROS is negative.     PHYSICAL EXAM:  Vital Signs -   Visit Vitals  BP 116/60   Pulse 60   Temp 97.6 F (36.4 C)   Ht 1.778 m (5\' 10" )   Wt 97.9 kg (215 lb 12.8 oz)   SpO2 97%   BMI 30.96 kg/m        GENERAL APPEARANCE: well developed, well nourished, alert and oriented X 3, no acute distress, appropriate affect  EYES: no lid lag, stare or proptosis, extraocular movement full and smooth, pupils equal, round, reactive to light  NECK: no jugular venous distention, no thyroid nodules, no thyromegaly, thyroid nontender  LYMPH NODES: no cervical or supraclavicular lymphadenopathy  HEART: normal rate and regular rhythm, no murmurs, rubs, gallops, or thrills  LUNGS: clear anteriorly and posteriorly, clear to auscultation bilaterally  ABDOMEN: bowel sounds  present, soft, nontender, nondistended, no hepatosplenomegaly  SKIN: warm and dry, no rashes  EXTREMITIES: no edema, clubbing or cyanosis, good capillary refill in nail beds  PERIPHERAL PULSES: palpable in all 4 extremities.   NEUROLOGIC: nonfocal  PSYCH: alert, oriented, cognitive function intact, judgment and insight good, mood/affect appropriate.           LABS  No results found for this visit on 02/27/23.  Orders Only on 02/24/2023   Component Date Value Ref Range Status    WBC, UA 02/24/2023 0-2  0 - 2 /HPF Final    RBC, UA 02/24/2023 0-2  0 - 2 /HPF Final    Squam Epithel, UA 02/24/2023 0-2  3 - 5 /HPF Final    MUCUS, URINE 02/24/2023 Moderate (A)  Not Seen /LPF Final    BACTERIA, URINE 02/24/2023 Not Seen  Rare Final    Amorphous, UA 02/24/2023 Moderate (A)  Not Seen /HPF Final       IMPRESSION/PLAN    1. SVT (supraventricular tachycardia) (HCC)  2. Encounter for screening colonoscopy  -     Joslyn Hy, DO - Gastroenterology  3. Typical atrial flutter (HCC)  4. Prediabetes  5. History of prostate cancer  6. Other male erectile dysfunction     Chronic issues are reviewed and are low risk and stable.  Labs reviewed with patient.  Specialist records including cardiology, electrophysiology, radiation oncology reviewed.  Due for colonoscopy.  Encouraged Shingrix vaccination.    Follow up and Dispositions:  Return in about 1 year (around 02/27/2024) for SAWV.       Oda Cogan, MD

## 2023-11-16 NOTE — Telephone Encounter (Signed)
 ON 11-16-23 @ 346 PM LMOM TO INFORM PT THAT 12-08-23 APPT WITH [facility] HAS BEEN MOVED FROM [facility] TO [facility]. IF PT CAN'T MAKE THAT APPT TO CALL THE OFFICE BACK TO R/S.

## 2023-12-08 ENCOUNTER — Ambulatory Visit: Admit: 2023-12-08 | Discharge: 2023-12-08 | Payer: MEDICARE | Attending: Physician Assistant | Primary: Family Medicine

## 2023-12-08 VITALS — BP 122/82 | HR 64 | Ht 70.0 in | Wt 223.0 lb

## 2023-12-08 DIAGNOSIS — I483 Typical atrial flutter: Secondary | ICD-10-CM

## 2023-12-08 NOTE — Progress Notes (Signed)
 Review of Systems   Constitutional:  Negative for chills, fatigue, fever and unexpected weight change.   HENT:  Negative for hearing loss, nosebleeds, sore throat, tinnitus and voice change.    Eyes:  Negative for visual disturbance.   Respiratory:  Negative for apnea, cough, chest tightness, shortness of breath and wheezing.    Cardiovascular:  Negative for chest pain, palpitations and leg swelling.   Gastrointestinal:  Negative for anal bleeding, blood in stool, constipation and diarrhea.   Endocrine: Negative for polydipsia.   Genitourinary:  Negative for difficulty urinating, dysuria, hematuria and urgency.   Musculoskeletal:  Negative for myalgias.   Skin:  Negative for rash and wound.   Neurological:  Negative for dizziness, syncope, weakness, light-headedness and numbness.   Hematological:  Does not bruise/bleed easily.   Psychiatric/Behavioral:  Negative for sleep disturbance.    All other systems reviewed and are negative.

## 2023-12-08 NOTE — Progress Notes (Signed)
 Date:  December 08, 2023  Patient name: Wayne Stone  Date of Birth: [birth year]      REASON FOR CLINIC VISIT: Follow-up Follow-up    Primary cardiologist: Dr. Dorcas Mcmurray    HISTORY OF PRESENT ILLNESS        Wayne Stone is a 73 y.o. male with history of SVT, typical atrial flutter s/p ablation, hyperlipidemia and prostate cancer (completed treatment in 2022) who presents for follow-up.  He was last seen 11/29/22 after undergoing AVNRT and CTI ablations with Dr. Tenny Craw 06/2022.  Patient is doing great.  He has had no recurrence of arrhythmia that he is aware of.  He wears Apple Watch to monitor this.  He walks 4 to 5 miles most days of the week and feels well with this.  Denies chest pain, shortness of breath, dizziness, syncope, palpitations, leg swelling, PND and orthopnea.  No other complaints at this time.    PAST HISTORY          Past Medical History:   has a past medical history of Prostate cancer (HCC), SVT (supraventricular tachycardia), and Umbilical hernia without obstruction or gangrene.    Past Surgical History:   has a past surgical history that includes Umbilical hernia repair (05/04/2016); other surgical history (12/2020); ep device procedure (07/14/2022); and ep device procedure (N/A, 07/14/2022).     Social History:   reports that he has never smoked. He has never been exposed to tobacco smoke. He has never used smokeless tobacco. He reports current alcohol use of about 1.0 standard drink of alcohol per week. He reports that he does not use drugs.     Family History: family history includes Coronary Art Dis in his father; High Cholesterol in his mother; Other in his mother.    Allergies:  Patient has no known allergies.    Home Medications:    Prior to Admission medications    Not on File       PHYSICAL EXAM            BP 122/82 (BP Site: Left Upper Arm, Patient Position: Sitting, BP Cuff Size: Medium Adult)   Pulse 64   Ht 1.778 m (5\' 10" )   Wt 101.2 kg (223 lb)   SpO2 96%   BMI 32.00 kg/m       CONSTITUTIONAL:  awake, alert, cooperative, no apparent distress  ENT:   moist mucus membranes  LUNGS:  clear to auscultation, non- labored  CARDIOVASCULAR:  regular rate and rhythm, no audible murmurs  ABDOMEN:  soft, non-tender, non-distended   EXTREMITIES:  no LE edema  NEUROLOGIC:  no focal deficits      LABS           No results found for: "CMP", "BMP", "CBC", "NTPROBNP", "LIPIDPAN", "HBA1C", "HFP"     No results found for this or any previous visit (from the past 4464 hours).    DIAGNOSTIC STUDIES           04/30/21    TRANSTHORACIC ECHOCARDIOGRAM (TTE) COMPLETE 04/30/2021  7:01 PM (Final)  Signed by: Megan Mans on 04/30/2021  7:01 PM      03/23/22    NM STRESS TEST WITH MYOCARDIAL PERFUSION 03/23/2022  2:16 PM (Final)    Interpretation Summary    Stress Combined Conclusion: Normal treadmill myocardial perfusion study at 93 %% PHR. Findings suggest a low risk of myocardial ischemia.    Stress Function: Left ventricular function post-stress is normal. Post-stress ejection fraction is 66%.  Perfusion Comments: LV perfusion is normal. There is no evidence of inducible ischemia.    Perfusion Conclusion: There is no evidence of transient ischemic dilation (TID). TID ratio is 1.16.    ECG: The ECG was borderline for diagnosis.    Stress Test: A Bruce protocol stress test was performed. Overall, the patient's exercise capacity was above average for their age. The patient was stressed for 12  and 0 . The patient experienced no angina during the test. Hemodynamics are adequate for diagnosis. Blood pressure demonstrated a normal response and heart rate demonstrated a normal response to stress. The patient's heart rate recovery was normal. The patient reported no symptoms during the stress test. The test was stopped because the patient experienced fatigue.    Signed by: Adah Perl on 03/23/2022  2:16 PM       No results found for this or any previous visit.       The 10-year ASCVD risk score (Arnett DK, et  al., 2019) is: 18.4%    Values used to calculate the score:      Age: 73 years      Sex: Male      Is Non-Hispanic African American: No      Diabetic: No      Tobacco smoker: No      Systolic Blood Pressure: 122 mmHg      Is BP treated: No      HDL Cholesterol: 65 mg/dL      Total Cholesterol: 198 mg/dL     ASSESSMENT / RECOMMENDATIONS        1. SVT (supraventricular tachycardia)  S/p ablation with Dr. Tenny Craw 06/2022. No recent palpitations or sensation of dysrhythmia to suggest recurrence of SVT.  In sinus rhythm on EKG.      2. Typical atrial flutter (HCC)  Induced typical atrial fibrillation from the EP study 06/2022.  Status post CTI ablation.  No recurrence. Will continue to monitor.     3. Mixed hyperlipidemia  Not presently on pharmacotherapy.  His lipid profile last year demonstrated LDL cholesterol in reasonable range.  Will continue with dietary and lifestyle interventions as the primary treatment strategy. Followed by PCP    Patient would like to be seen every other year which I think is reasonable. He will notify us if anything change or he were to have recurrence of his arrhythmia.      Wilfred Curtis, [facility] / [facility]

## 2024-02-21 ENCOUNTER — Telehealth

## 2024-02-21 NOTE — Telephone Encounter (Signed)
 Patient scheduled 06/10 for Annual Wellness.    Please place order for labs and notify patient

## 2024-02-27 ENCOUNTER — Encounter

## 2024-02-27 LAB — COMPREHENSIVE METABOLIC PANEL
ALT: 17 U/L (ref 0–42)
AST: 25 U/L (ref 0–46)
Albumin/Globulin Ratio: 1.6 (ref 1.00–2.70)
Albumin: 4.1 g/dL (ref 3.5–5.2)
Alk Phosphatase: 58 U/L (ref 40–130)
Anion Gap: 11 mmol/L (ref 2–17)
BUN: 15 mg/dL (ref 8–23)
CO2: 22 mmol/L (ref 22–29)
Calcium: 8.7 mg/dL (ref 8.5–10.7)
Chloride: 106 mmol/L (ref 98–107)
Creatinine: 1 mg/dL (ref 0.7–1.3)
Est, Glom Filt Rate: 79 mL/min/1.73mÂ² (ref >=60)
Globulin: 2.5 g/dL (ref 1.9–4.4)
Glucose: 98 mg/dL (ref 70–99)
Osmolaliy Calculated: 278 mosm/kg (ref 270–287)
Potassium: 3.7 mmol/L (ref 3.5–5.3)
Sodium: 139 mmol/L (ref 135–145)
Total Bilirubin: 1.55 mg/dL — ABNORMAL HIGH (ref 0.00–1.20)
Total Protein: 6.6 g/dL (ref 5.7–8.3)

## 2024-02-27 LAB — URINALYSIS W/ RFLX MICROSCOPIC
Bilirubin, Urine: NEGATIVE
Blood, Urine: NEGATIVE
Glucose, Ur: NEGATIVE
Ketones, Urine: NEGATIVE
Leukocyte Esterase, Urine: NEGATIVE
Nitrite, Urine: NEGATIVE
Protein, UA: NEGATIVE
Specific Gravity, UA: <=1.005 (ref 1.003–1.035)
Urobilinogen, Urine: 0.2 EU/dL (ref 0.2–1.0)
pH, Urine: 6.5 (ref 4.5–8.0)

## 2024-02-27 LAB — CBC WITH AUTO DIFFERENTIAL
Basophils %: 0.3 % (ref 0.0–2.0)
Basophils Absolute: 0 10*3/uL (ref 0.0–0.2)
Eosinophils %: 2.2 % (ref 0.0–7.0)
Eosinophils Absolute: 0.1 10*3/uL (ref 0.0–0.5)
Hematocrit: 38.2 % (ref 38.0–52.0)
Hemoglobin: 13 g/dL (ref 13.0–17.3)
Immature Grans (Abs): 0.01 10*3/uL (ref 0.00–0.06)
Immature Granulocytes %: 0.2 % (ref 0.0–0.6)
Lymphocytes Absolute: 1.9 10*3/uL (ref 1.0–3.2)
Lymphocytes: 32.6 % (ref 15.0–45.0)
MCH: 29.3 pg (ref 27.0–34.5)
MCHC: 34 g/dL (ref 30.0–36.0)
MCV: 86 fL (ref 84.0–100.0)
MPV: 9.4 fL (ref 7.0–12.2)
Monocytes %: 10.6 % (ref 4.0–12.0)
Monocytes Absolute: 0.6 10*3/uL (ref 0.3–1.0)
NRBC Absolute: 0 10*3/uL (ref 0.000–0.012)
NRBC Automated: 0 % (ref 0.0–0.2)
Neutrophils %: 54.1 % (ref 42.0–74.0)
Neutrophils Absolute: 3.2 10*3/uL (ref 1.6–7.3)
Platelets: 343 10*3/uL (ref 140–440)
RBC: 4.44 x10e6/mcL (ref 4.00–5.60)
RDW: 13.3 % (ref 10.0–17.0)
WBC: 5.9 10*3/uL (ref 3.8–10.6)

## 2024-02-27 LAB — LIPID PANEL
Chol/HDL Ratio: 3.3 (ref 0.0–4.4)
Cholesterol, Total: 190 mg/dL (ref 100–200)
HDL: 57 mg/dL (ref >=40)
LDL Cholesterol: 119 mg/dL — ABNORMAL HIGH (ref 0.0–100.0)
LDL/HDL Ratio: 2.1
Triglycerides: 70 mg/dL (ref 0–149)
VLDL: 14 mg/dL (ref 5.0–40.0)

## 2024-02-27 LAB — PSA, DIAGNOSTIC: PSA: 0.208 ng/mL (ref 0.000–4.000)

## 2024-02-27 LAB — HEMOGLOBIN A1C
Estimated Avg Glucose: 123
Estimated Avg Glucose: 133
Hemoglobin A1C: 5.9 % (ref 4.0–6.0)

## 2024-02-27 LAB — TSH REFLEX TO FT4: TSH: 1.67 u[IU]/mL (ref 0.358–3.740)

## 2024-03-05 ENCOUNTER — Ambulatory Visit: Admit: 2024-03-05 | Discharge: 2024-03-05 | Payer: MEDICARE | Attending: Family Medicine | Primary: Family Medicine

## 2024-03-05 VITALS — BP 122/68 | HR 70 | Temp 97.90000°F | Ht 70.0 in | Wt 220.2 lb

## 2024-03-05 DIAGNOSIS — E782 Mixed hyperlipidemia: Secondary | ICD-10-CM

## 2024-03-05 NOTE — Progress Notes (Signed)
 Positive Risk Factor Screenings with Interventions:                  Activity, Diet, and Weight:    Abnormal BMI (obese):  Body mass index is 31.6 kg/m. (!) Abnormal       Provider list reviewed and updated.      Positive Risk Factor Screenings with Interventions:                  Activity, Diet, and Weight:    Abnormal BMI (obese):  Body mass index is 31.6 kg/m. (!) Abnormal             CHIEF COMPLAINT:  Chief Complaint   Patient presents with    Medicare AWV        HISTORY OF PRESENT ILLNESS:  Wayne Stone is a 73 y.o. male  who presents for SA W[place] and review of chronic issues.  Patient is stable from an SVT standpoint status post ablation.  Continues with cardiology follow up. Patient is status post prostate cancer treatment and PSA remains low and stable. Follows with his radiation oncologist.  Patient with stable prediabetes with an A1c of 5.9.  Patient with some post prostate cancer treatment erectile dysfunction and hot flashes but stable.     Has some bilateral cerumen affecting hearing.    PHQ:      02/25/2024     2:04 PM   PHQ-9    Little interest or pleasure in doing things 0   Feeling down, depressed, or hopeless 0   PHQ-2 Score 0    PHQ-9 Total Score 0        Patient-reported       CURRENT MEDICATION LIST:    No current outpatient medications on file.     No current facility-administered medications for this visit.        ALLERGIES:    No Known Allergies     HISTORY:  Past Medical History:   Diagnosis Date    Prostate cancer (HCC)     SVT (supraventricular tachycardia)     Umbilical hernia without obstruction or gangrene       Past Surgical History:   Procedure Laterality Date    EP DEVICE PROCEDURE  07/14/2022    Dr. Clydia Dart    EP DEVICE PROCEDURE N/A 07/14/2022    SVT & typical atrial flutter - Dr Clydia Dart    OTHER SURGICAL HISTORY  12/2020    seed implation for prostate cancer    UMBILICAL HERNIA REPAIR  05/04/2016    robotic; dr. Colby Daub simpson      Social History     Socioeconomic History    Marital status:  Married     Spouse name: Not on file    Number of children: Not on file    Years of education: Not on file    Highest education level: Not on file   Occupational History    Not on file   Tobacco Use    Smoking status: Never     Passive exposure: Never    Smokeless tobacco: Never    Tobacco comments:     02/24/2022   Vaping Use    Vaping status: Never Used   Substance and Sexual Activity    Alcohol use: Yes     Alcohol/week: 1.0 standard drink of alcohol     Types: 1 Shots of liquor per week    Drug use: Never    Sexual activity: Not Currently  Partners: Female   Other Topics Concern    Not on file   Social History Narrative    Not on file     Social Drivers of Health     Financial Resource Strain: Not on file   Food Insecurity: Not on file   Transportation Needs: Not on file   Physical Activity: Sufficiently Active (02/25/2024)    Exercise Vital Sign     Days of Exercise per Week: 4 days     Minutes of Exercise per Session: 60 min   Stress: Not on file   Social Connections: Not on file   Intimate Partner Violence: Not on file   Housing Stability: Not on file      Family History   Problem Relation Age of Onset    High Cholesterol Mother     Coronary Art Dis Father         REVIEW OF SYSTEMS:  Per HPI. Otherwise balance of 10 point ROS is negative.     PHYSICAL EXAM:  Vital Signs -   Visit Vitals  BP 122/68   Pulse 70   Temp 97.9 F (36.6 C) (Oral)   Ht 1.778 m (5' 10)   Wt 99.9 kg (220 lb 3.2 oz)   SpO2 95%   BMI 31.60 kg/m        GENERAL APPEARANCE: well developed, well nourished, alert and oriented X 3, no acute distress, appropriate affect  EYES: no lid lag, stare or proptosis, extraocular movement full and smooth, pupils equal, round, reactive to light  EARS: cerumen impaction bilaterally-once clear-ear canals were healthy appearing  NECK: no jugular venous distention, no thyroid nodules, no thyromegaly, thyroid nontender  LYMPH NODES: no cervical or supraclavicular lymphadenopathy  HEART: normal rate and  regular rhythm, no murmurs, rubs, gallops, or thrills  LUNGS: clear anteriorly and posteriorly, clear to auscultation bilaterally  ABDOMEN: bowel sounds present, soft, nontender, nondistended, no hepatosplenomegaly  SKIN: warm and dry, no rashes  EXTREMITIES: no edema, clubbing or cyanosis, good capillary refill in nail beds  PERIPHERAL PULSES: palpable in all 4 extremities.   NEUROLOGIC: nonfocal  GU: normal testicular/scrotal examination  PSYCH: alert, oriented, cognitive function intact, judgment and insight good, mood/affect appropriate.           LABS  No results found for this visit on 03/05/24.  Orders Only on 02/27/2024   Component Date Value Ref Range Status    PSA 02/27/2024 0.208  0.000 - 4.000 ng/mL Final    Comment: PSA INTERPRETATION:    At this time, no major scientific/medical organization including the American  Cancer Society and the American Urological Association have specific  recommendations in regard to prostate specific antigen (PSA) testing other  than recommending that men at age 59 and above and those who are at high risk  at age 67-45 and above be counseled in regard to the pros and cons of PSA  testing.    Guidelines for risk stratification    1.  Below 1:   Very low risk of prostate cancer  2.  1-4:        Approximately 15% risk of prostate cancer  3.  4-10:       25% risk of prostate cancer  4.  >10:        50% risk of prostate cancer    The use of PSA velocity (rate of rise of PSA over time) may also be useful in  predicting the risk of prostate cancer.  Most  authorities recommend PSA  testing on at least three occasions over a period of 18 months to get an  accurate PSA velocity.    PSA measured by Roche [employer] electrochemiluminescence immunoassay ECLIA  methodology.  PSA valu                           es determined on patient samples by different testing procedures  cannot be directly compared with one another and could be the cause of  erroneous medical  interpretations.    References available by request.      TSH 02/27/2024 1.670  0.358 - 3.740 mcIU/mL Final    Comment: TSH INTERPRETATION:    Controversy exists over an acceptable TSH range. Some experts argue that a  narrower reference range is better and will increase the detection of thyroid  disease, particularly marginal hypothyroidism.      WBC 02/27/2024 5.9  3.8 - 10.6 x10e3/mcL Final    RBC 02/27/2024 4.44  4.00 - 5.60 x10e6/mcL Final    Hemoglobin 02/27/2024 13.0  13.0 - 17.3 g/dL Final    Hematocrit 84/13/2440 38.2  38.0 - 52.0 % Final    MCV 02/27/2024 86.0  84.0 - 100.0 fL Final    MCH 02/27/2024 29.3  27.0 - 34.5 pg Final    MCHC 02/27/2024 34.0  30.0 - 36.0 g/dL Final    RDW 07/23/2535 13.3  10.0 - 17.0 % Final    Platelets 02/27/2024 343  140 - 440 x10e3/mcL Final    MPV 02/27/2024 9.4  7.0 - 12.2 fL Final    NRBC Automated 02/27/2024 0.0  0.0 - 0.2 % Final    NRBC Absolute 02/27/2024 0.000  0.000 - 0.012 x10e3/mcL Final    Neutrophils % 02/27/2024 54.1  42.0 - 74.0 % Final    Lymphocytes 02/27/2024 32.6  15.0 - 45.0 % Final    Monocytes % 02/27/2024 10.6  4.0 - 12.0 % Final    Eosinophils % 02/27/2024 2.2  0.0 - 7.0 % Final    Basophils % 02/27/2024 0.3  0.0 - 2.0 % Final    Neutrophils Absolute 02/27/2024 3.2  1.6 - 7.3 x10e3/mcL Final    Lymphocytes Absolute 02/27/2024 1.9  1.0 - 3.2 x10e3/mcL Final    Monocytes Absolute 02/27/2024 0.6  0.3 - 1.0 x10e3/mcL Final    Eosinophils Absolute 02/27/2024 0.1  0.0 - 0.5 x10e3/mcL Final    Basophils Absolute 02/27/2024 0.0  0.0 - 0.2 x10e3/mcL Final    Immature Granulocytes % 02/27/2024 0.2  0.0 - 0.6 % Final    Immature Grans (Abs) 02/27/2024 0.01  0.00 - 0.06 x10e3/mcL Final    Sodium 02/27/2024 139  135 - 145 mmol/L Final    Potassium 02/27/2024 3.7  3.5 - 5.3 mmol/L Final    Chloride 02/27/2024 106  98 - 107 mmol/L Final    CO2 02/27/2024 22  22 - 29 mmol/L Final    Glucose 02/27/2024 98  70 - 99 mg/dL Final    BUN 64/40/3474 15  8 - 23 mg/dL Final     Creatinine 25/95/6387 1.0  0.7 - 1.3 mg/dL Final    Anion Gap 56/43/3295 11  2 - 17 mmol/L Final    Osmolaliy Calculated 02/27/2024 278  270 - 287 mOsm/kg Final    Calcium 02/27/2024 8.7  8.5 - 10.7 mg/dL Final    Total Protein 02/27/2024 6.6  5.7 - 8.3 g/dL Final    Albumin 18/84/1660 4.1  3.5 - 5.2 g/dL Final    Globulin 16/06/9603 2.5  1.9 - 4.4 g/dL Final    Albumin/Globulin Ratio 02/27/2024 1.60  1.00 - 2.70 Final    Total Bilirubin 02/27/2024 1.55 (H)  0.00 - 1.20 mg/dL Final    Alk Phosphatase 02/27/2024 58  40 - 130 unit/L Final    AST 02/27/2024 25  0 - 46 unit/L Final    ALT 02/27/2024 17  0 - 42 unit/L Final    Est, Glom Filt Rate 02/27/2024 79  >=60 mL/min/1.31m Final    Comment: VERIFIED by Discern Expert.  GFR Interpretation:                                                                         % OF  KIDNEY  GFR                                                        STAGE  FUNCTION  ==================================================================================    > 90        Normal kidney function                       STAGE 1  90-100%  89 to 60      Mild loss of kidney function                 STAGE 2  80-60%  59 to 45      Mild to moderate loss of kidney function     STAGE 3a  59-45%  44 to 30      Moderate to severe loss of kidney function   STAGE 3b  44-30%  29 to 15      Severe loss of kidney function               STAGE 4  29-15%    < 15        Kidney failure                               STAGE 5  <15%  ==================================================================================  Modified from National Kidney Foundation    GFR Calculation performed using the CKD-EPI 2021 equation developed for use  with IDMS traceable creatinine methods and                            is the calculation recommended by  the [facility] for estimating GFR in adults.      Cholesterol, Total 02/27/2024 190  100 - 200 mg/dL Final    Comment: The National Cholesterol Education Program has  published reference  cholesterol values for cardiovascular risk to be:    Less than 200 mg/dL     = Low Risk    540 to 239 mg/dL        = Borderline Risk    240mg /dL and greater    = High Risk      HDL 02/27/2024  57  >=40 mg/dL Final    Comment: The National Lipid Association and the [employer] Cholesterol Education Program  (NCEP) have set the guidelines for high-density lipoprotein (HDL) cholesterol  in adults ages 43 and up.      Triglycerides 02/27/2024 70  0 - 149 mg/dL Final    Comment:   TRIGLYCERIDE INTERPRETATION:                          Recommended Fasting Triglyc Levels for Adults                        =============================================                        Desirable                        < 150 mg/dL                        Average                          < 200 mg/dL                        Borderline High             200 to 500 mg/dL                        Hypertriglyceridemic             > 500 mg/dL                        =============================================      LDL Cholesterol 02/27/2024 119.0 (H)  0.0 - 100.0 mg/dL Final    Comment: [facility] utilize the [employer] equation for calculating LDL.    LDL = Total Cholesterol - HDL Cholesterol - (Triglyceride/5)      LDL/HDL Ratio 02/27/2024 2.1   Final    Chol/HDL Ratio 02/27/2024 3.3  0.0 - 4.4 Final    VLDL 02/27/2024 14.0  5.0 - 40.0 mg/dL Final    Color, UA 29/56/2130 Yellow   Final    Appearance 02/27/2024 Clear   Final    Glucose, Ur 02/27/2024 Negative  Negative Final    Bilirubin, Urine 02/27/2024 Negative  Negative Final    Ketones, Urine 02/27/2024 Negative  Negative Final    Specific Gravity, UA 02/27/2024 <=1.005  1.003 - 1.035 Final    Blood, Urine 02/27/2024 Negative  Negative Final    pH, Urine 02/27/2024 6.5  4.5 - 8.0 Final    Protein, UA 02/27/2024 Negative  Negative Final    Urobilinogen, Urine 02/27/2024 0.2  0.2 - 1.0 EU/dL Final    Nitrite, Urine 02/27/2024 Negative  Negative Final    Leukocyte  Esterase, Urine 02/27/2024 Negative  Negative Final    Hemoglobin A1C 02/27/2024 5.9  4.0 - 6.0 % Final    Comment: HEMOGLOBIN A1C INTERPRETATION:    The following arbitrary ranges may be used for interpretation of the results.  However, factors such as duration of diabetes, adherence to therapy, and  patient age should also be considered in assessing degree of blood glucose  control.    Hemoglobin A1C  Avg. Blood Sugar  --------------------------------------------------------------  6%                           135 mg/dL  7%                           170 mg/dL  8%                           205 mg/dL  9%                           240 mg/dL  16%                          275 mg/dL    ======================================================    A1C                      Glucose Control  ----------------------------------------------------------------  < 6.0 %                   Normal  6.0 - 6.9 %               Abnormal  7.0 - 7.9 %               Sub-Optimal Control  > 8.0 %                   Inadequate Control      Estimated Avg Glucose 02/27/2024 123   Final    Estimated Avg Glucose 02/27/2024 133   Final       IMPRESSION/PLAN    1. Mixed hyperlipidemia  2. Colon cancer screening  -     Cologuard (Fecal DNA Colorectal Cancer Screening)  3. Elevated LDL cholesterol level  -     CT CARDIAC CALCIUM SCORING; Future  -     Vascular duplex carotid bilateral; Future  4. Typical atrial flutter (HCC)  Overview:  S/p ablation  5. Benign prostatic hyperplasia with urinary obstruction  6. History of prostate cancer  7. Other male erectile dysfunction  8. Prediabetes  9. Bilateral hearing loss due to cerumen impaction       Chronic issues are reviewed and are low risk and stable.  Labs reviewed with patient (7 in total).  Specialist records including cardiology, electrophysiology, radiation oncology reviewed.  Cologuard ordered for patient. Immunizations reviewed and discussed/recommended.     Bilateral cerumen  impaction removed manually with ear curettage.  Patient could hear better once removed.  Patient tolerated procedure well.    Follow up and Dispositions:  No follow-ups on file.       Almon Arms, MD

## 2024-03-16 LAB — FECAL DNA COLORECTAL CANCER SCREENING (COLOGUARD): FIT-DNA (Cologuard): NEGATIVE

## 2024-03-17 ENCOUNTER — Encounter

## 2024-03-20 ENCOUNTER — Inpatient Hospital Stay: Admit: 2024-03-20 | Payer: MEDICARE | Attending: Family Medicine | Primary: Family Medicine

## 2024-03-20 DIAGNOSIS — E7800 Pure hypercholesterolemia, unspecified: Secondary | ICD-10-CM

## 2024-03-21 LAB — VAS DUP CAROTID BILATERAL
Left CCA dist EDV: 17.1 cm/s
Left CCA dist PSV: 69.5 cm/s
Left CCA prox EDV: 21.2 cm/s
Left CCA prox PSV: 121.6 cm/s
Left ECA EDV: 6.6 cm/s
Left ECA PSV: 97.8 cm/s
Left ICA dist EDV: 35.9 cm/s
Left ICA dist PSV: 107.2 cm/s
Left ICA mid EDV: 274 cm/s
Left ICA mid PSV: 118.9 cm/s
Left ICA prox EDV: 43.1 cm/s
Left ICA prox PSV: 158.1 cm/s
Left ICA/CCA PSV: 2.3 no units
Left subclavian prox EDV: 0 cm/s
Left subclavian prox PSV: 150.7 cm/s
Left vertebral EDV: 14.7 cm/s
Left vertebral PSV: 62.7 cm/s
Right CCA dist EDV: 14.9 cm/s
Right CCA prox EDV: 16.2 cm/s
Right CCA prox PSV: 83.1 cm/s
Right ECA EDV: 5 cm/s
Right ECA PSV: 85.9 cm/s
Right ICA dist EDV: 21.2 cm/s
Right ICA dist PSV: 73.2 cm/s
Right ICA mid EDV: 26.6 cm/s
Right ICA mid PSV: 103.3 cm/s
Right ICA prox EDV: 45.3 cm/s
Right ICA prox PSV: 202.5 cm/s
Right ICA/CCA PSV: 3.5 no units
Right cca dist PSV: 58.6 cm/s
Right subclavian prox EDV: 0 cm/s
Right subclavian prox PSV: 128.3 cm/s
Right vertebral EDV: 19.1 cm/s
Right vertebral PSV: 74.2 cm/s

## 2024-03-25 MED ORDER — ATORVASTATIN CALCIUM 20 MG PO TABS
20 | ORAL_TABLET | Freq: Every day | ORAL | 0 refills | 90.00000 days | Status: DC
Start: 2024-03-25 — End: 2024-06-24

## 2024-03-25 NOTE — Telephone Encounter (Signed)
 Spoke with patient regarding his carotid artery ultrasound results that show moderate stenosis bilaterally.  We discussed referral to vascular surgery for further evaluation and management.  We also discussed starting on a statin to help lower his cholesterol levels and reduce his risk of stroke.  He is agreeable to this.  We will have him follow-up with repeat labs beforehand in 3 months.

## 2024-03-25 NOTE — Telephone Encounter (Signed)
-----   Message from Dr. Prentice Kelch, MD sent at 03/25/2024 11:51 AM EDT -----  Please inform patient that his carotid ultrasounds did show plaque in both arteries, moderate degree. While this shouldn't require any vascular surgery, it is enough to where statin therapy is absolutely encouraged and I would recommend a vascular consult for monitoring. The CT cardiac score was modest (88) so the statin would help with that as well. Please refer to vascular

## 2024-04-05 ENCOUNTER — Ambulatory Visit: Admit: 2024-04-05 | Discharge: 2024-04-05 | Payer: MEDICARE | Attending: Surgery | Primary: Family Medicine

## 2024-04-05 VITALS — BP 116/69 | HR 64 | Ht 70.0 in | Wt 215.0 lb

## 2024-04-05 DIAGNOSIS — I6523 Occlusion and stenosis of bilateral carotid arteries: Principal | ICD-10-CM

## 2024-04-05 NOTE — Progress Notes (Signed)
 [facility] Note    HISTORY OF PRESENT ILLNESS:    Mr. Cuthrell is a 73 year old man who was referred to the office today for evaluation of asymptomatic moderate carotid stenosis.  He had a carotid duplex performed a couple of weeks ago by his primary care doctor as a screening which revealed a moderate bilateral carotid stenosis.  Patient tells me he is never had a prior study.  He denies any history of stroke or TIA.  Specifically denies any lateral numbness or weakness or monocular vision loss, facial droop or aphasia.    He moved here from [place]  several years ago to be near his family.  He is currently retired.  He is quite active walks 5 miles on some days although less in the summer because of the heat.  He also bikes.  He has never smoked he is nondiabetic he has never had any vascular operations or seen a vascular surgeon.  Denies any cardiac, hepatic or renal dysfunction.  Did have an ablation for SVT in the past.    He has never had any neck operations neck radiation neck or mouth cancer.          Past Medical History:    Past Medical History:   Diagnosis Date    Carotid artery calcification, bilateral 03/20/2024    Prostate cancer (HCC)     SVT (supraventricular tachycardia)     Umbilical hernia without obstruction or gangrene        Past Surgical History:    Past Surgical History:   Procedure Laterality Date    EP DEVICE PROCEDURE  07/14/2022    Dr. Iona    EP DEVICE PROCEDURE N/A 07/14/2022    SVT & typical atrial flutter - Dr Iona    OTHER SURGICAL HISTORY  12/2020    seed implation for prostate cancer    UMBILICAL HERNIA REPAIR  05/04/2016    robotic; dr. ardyth simpson       Current Medications:   Current Outpatient Medications   Medication Instructions    atorvastatin  (LIPITOR) 20 mg, Oral, DAILY       Allergies:  No Known Allergies    REVIEW OF SYSTEMS:    12 point review of systems obtained and negative except per HPI.    PHYSICAL EXAM:    Vitals:    04/05/24 0950    BP: 116/69   Pulse: 64   SpO2:        General: Elderly gentleman very pleasant friendly healthy-appearing normal stature.  Slightly overweight  Neurological: No focal deficits.  Symmetric smile and tongue moving extremities normally  HEENT: Head and neck no scars average size  CV: Regular rate and rhythm  Pulmonary: No increased work of breathing on room air  Abdomen: Soft nontender no palpable pulsatile mass no scars.  Lower extremity:  Right: No mass behind the knee  Left: No mass behind the knee  Vascular:  Palpable femoral pulses bilateral  Palpable popliteal pulses bilateral    STUDIES:      Vascular studies:    March 20, 2024 carotid duplex:  Right: Max ICA PSV 203.  EDV 45  Left: Max ICA PSV 158.  EDV 43    ASSESSMENT AND PLAN:  1. Bilateral carotid artery stenosis       73 year old man in the office today for initial evaluation of an asymptomatic bilateral moderate carotid stenosis.  Had extensive discussion with patient and wife about medical treatment of asymptomatic carotid  stenosis which includes aspirin, statin, blood pressure control and smoking abstinence.  He is already doing all of this except for the baby aspirin which he says he will start.    He is well below the 70% minimum threshold for revascularization for asymptomatic carotid disease.  Did discuss ACAS trial results and current SVS guidelines for revascularization.  Did discuss the need for annual surveillance I will see him back in 1 year with a carotid duplex.  He was educated on the specific signs of a carotid base stroke.  He and his wife seem to have a very good understanding of the situation and agreed with the plan.    Electronically signed by Dorn Deward Medicus, MD on 04/05/2024 at 10:18 AM.    Please note this chart was generated using dragon dictation software. Although every effort was made to ensure the accuracy of this automated transcription, some errors in transcription may have occurred.

## 2024-06-19 ENCOUNTER — Encounter

## 2024-06-19 LAB — COMPREHENSIVE METABOLIC PANEL
ALT: 28 U/L (ref 0–42)
AST: 29 U/L (ref 0–46)
Albumin/Globulin Ratio: 1.9 (ref 1.00–2.70)
Albumin: 4.2 g/dL (ref 3.5–5.2)
Alk Phosphatase: 59 U/L (ref 40–130)
Anion Gap: 13 mmol/L (ref 2–17)
BUN: 12 mg/dL (ref 8–23)
CO2: 21 mmol/L — ABNORMAL LOW (ref 22–29)
Calcium: 9.1 mg/dL (ref 8.5–10.7)
Chloride: 105 mmol/L (ref 98–107)
Creatinine: 1 mg/dL (ref 0.7–1.3)
Est, Glom Filt Rate: 79 mL/min/1.73m (ref >=60)
Globulin: 2.2 g/dL (ref 1.9–4.4)
Glucose: 96 mg/dL (ref 70–99)
Osmolaliy Calculated: 277 mosm/kg (ref 270–287)
Potassium: 3.6 mmol/L (ref 3.5–5.3)
Sodium: 139 mmol/L (ref 135–145)
Total Bilirubin: 1.93 mg/dL — ABNORMAL HIGH (ref 0.00–1.20)
Total Protein: 6.4 g/dL (ref 5.7–8.3)

## 2024-06-19 LAB — LIPID PANEL
Chol/HDL Ratio: 2.3 (ref 0.0–4.4)
Cholesterol, Total: 126 mg/dL (ref 100–200)
HDL: 56 mg/dL (ref >=40)
LDL Cholesterol: 54.4 mg/dL (ref 0.0–100.0)
LDL/HDL Ratio: 1
Triglycerides: 78 mg/dL (ref 0–149)
VLDL: 15.6 mg/dL (ref 5.0–40.0)

## 2024-06-24 ENCOUNTER — Encounter: Payer: MEDICARE | Primary: Family Medicine

## 2024-06-24 ENCOUNTER — Ambulatory Visit: Admit: 2024-06-24 | Discharge: 2024-06-24 | Payer: MEDICARE | Primary: Family Medicine

## 2024-06-24 VITALS — BP 102/72 | HR 71 | Temp 98.10000°F | Resp 18 | Ht 70.0 in | Wt 223.4 lb

## 2024-06-24 DIAGNOSIS — E782 Mixed hyperlipidemia: Principal | ICD-10-CM

## 2024-06-24 MED ORDER — ATORVASTATIN CALCIUM 20 MG PO TABS
20 | ORAL_TABLET | Freq: Every day | ORAL | 2 refills | 90.00000 days | Status: AC
Start: 2024-06-24 — End: ?

## 2024-06-24 NOTE — Assessment & Plan Note (Addendum)
 This is a chronic condition that is now well-controlled since starting on atorvastatin .  Refill sent to the pharmacy.    Orders:    atorvastatin  (LIPITOR) 20 MG tablet; Take 1 tablet by mouth daily

## 2024-06-24 NOTE — Progress Notes (Signed)
 Wayne Stone (DOB:  [birth year]) is a 73 y.o. male, Established patient is here for evaluation of the following chief complaint(s):   Chief Complaint   Patient presents with    Discuss Labs     Lipid  f/u       PHQ:      06/24/2024     3:25 PM   PHQ-9    Little interest or pleasure in doing things 0   Feeling down, depressed, or hopeless 0   PHQ-2 Score 0   PHQ-9 Total Score 0       History of Present Illness  Wayne Stone is a 73 y.o. male  who presents for 3 month follow up for hyperlipidemia after starting on atorvastatin .  He is tolerating it well without any side effects.  He also saw vascular surgery a few months ago due to moderate plaque buildup in his carotid arteries bilaterally.  They started him on baby aspirin which he is compliant with.  He denies any chest pain or shortness of breath.    Current Outpatient Medications   Medication Sig Dispense Refill    atorvastatin  (LIPITOR) 20 MG tablet Take 1 tablet by mouth daily 90 tablet 2    aspirin 81 MG EC tablet Take 1 tablet by mouth daily       No current facility-administered medications for this visit.         Past Medical History:  03/20/2024: Carotid artery calcification, bilateral  No date: Prostate cancer (HCC)  No date: SVT (supraventricular tachycardia)  No date: Umbilical hernia without obstruction or gangrene      Past Surgical History:  07/14/2022: EP DEVICE PROCEDURE      Comment:  Dr. Iona  07/14/2022: EP DEVICE PROCEDURE; N/A      Comment:  SVT & typical atrial flutter - Dr Iona  12/2020: OTHER SURGICAL HISTORY      Comment:  seed implation for prostate cancer  05/04/2016: UMBILICAL HERNIA REPAIR      Comment:  robotic; dr. ardyth simpson    Family History   Problem Relation Age of Onset    High Cholesterol Mother     Other Mother     Coronary Art Dis Father     Atrial Fibrillation Brother         Social History     Socioeconomic History    Marital status: Married   Tobacco Use    Smoking status: Never     Passive exposure: Never    Smokeless  tobacco: Never    Tobacco comments:     02/24/2022   Vaping Use    Vaping status: Never Used   Substance and Sexual Activity    Alcohol use: Yes     Alcohol/week: 1.0 standard drink of alcohol     Types: 1 Shots of liquor per week    Drug use: Never    Sexual activity: Not Currently     Partners: Female     Social Drivers of Health     Physical Activity: Sufficiently Active (02/25/2024)    Exercise Vital Sign     Days of Exercise per Week: 4 days     Minutes of Exercise per Session: 60 min        No Known Allergies      Review of Systems   See HPI for pertinent positives and negatives.    BP 102/72 (BP Site: Left Upper Arm, Patient Position: Sitting, BP Cuff Size: Medium Adult)   Pulse  71   Temp 98.1 F (36.7 C) (Oral)   Resp 18   Ht 1.778 m (5' 10)   Wt 101.3 kg (223 lb 6.4 oz)   SpO2 98%   BMI 32.05 kg/m       LABS  Lab Results   Component Value Date    WBC 5.9 02/27/2024    HGB 13.0 02/27/2024    HCT 38.2 02/27/2024    MCV 86.0 02/27/2024    PLT 343 02/27/2024     Lab Results   Component Value Date/Time    NA 139 06/19/2024 07:26 AM    K 3.6 06/19/2024 07:26 AM    CL 105 06/19/2024 07:26 AM    CO2 21 06/19/2024 07:26 AM    BUN 12 06/19/2024 07:26 AM    CREATININE 1.0 06/19/2024 07:26 AM    GLUCOSE 96 06/19/2024 07:26 AM    CALCIUM  9.1 06/19/2024 07:26 AM      Lab Results   Component Value Date    CHOL 126 06/19/2024     Lab Results   Component Value Date    TRIG 78 06/19/2024     Lab Results   Component Value Date    HDL 56 06/19/2024     No components found for: [facility]  Lab Results   Component Value Date    VLDL 15.6 06/19/2024     Lab Results   Component Value Date    CHOLHDLRATIO 2.3 06/19/2024     Lab Results   Component Value Date    PSA 0.208 02/27/2024     Lab Results   Component Value Date    TSH 1.670 02/27/2024           Physical Exam  Vitals and nursing note reviewed.   Constitutional:       General: He is not in acute distress.     Appearance: Normal appearance. He is not  toxic-appearing.   HENT:      Head: Normocephalic and atraumatic.   Eyes:      Conjunctiva/sclera: Conjunctivae normal.   Cardiovascular:      Rate and Rhythm: Normal rate and regular rhythm.      Heart sounds: Normal heart sounds.   Pulmonary:      Effort: Pulmonary effort is normal. No respiratory distress.      Breath sounds: Normal breath sounds.   Abdominal:      General: Abdomen is flat.   Musculoskeletal:      Cervical back: Neck supple.   Skin:     General: Skin is warm and dry.   Neurological:      General: No focal deficit present.      Mental Status: He is alert.   Psychiatric:         Mood and Affect: Mood normal.         Behavior: Behavior normal.         Thought Content: Thought content normal.           Assessment/Plan  Assessment & Plan  Mixed hyperlipidemia   This is a chronic condition that is now well-controlled since starting on atorvastatin .  Refill sent to the pharmacy.    Orders:    atorvastatin  (LIPITOR) 20 MG tablet; Take 1 tablet by mouth daily    Carotid artery calcification, bilateral   Monitored by specialist- no acute findings meriting change in the plan.  Continue current dose of statin and daily baby aspirin.    Orders:  atorvastatin  (LIPITOR) 20 MG tablet; Take 1 tablet by mouth daily        Assessment & Plan     No follow-ups on file.       Health Maintenance Due   Topic Date Due    Flu vaccine (1) 04/26/2024    COVID-19 Vaccine (6 - 2025-26 season) 05/27/2024       --Jane Sallies, PA on 06/24/2024 at 3:40 PM      This note was generated completely or in part utilizing Dragon dictation speech recognition software.  Occasionally, words are mistranscribed and despite editing, the text may contain inaccuracies due to incorrect word recognition.  If further clarification is needed please contact the office at [phone]
